# Patient Record
Sex: Male | Born: 1980
Health system: Southern US, Community
[De-identification: ages and names within clinical notes are randomized; demographics above are authoritative.]

## PROBLEM LIST (undated history)

## (undated) DIAGNOSIS — F32A Depression, unspecified: Secondary | ICD-10-CM

## (undated) DIAGNOSIS — F419 Anxiety disorder, unspecified: Secondary | ICD-10-CM

---

## 2013-12-23 ENCOUNTER — Encounter (HOSPITAL_COMMUNITY)
Admission: EM | Disposition: A | Discharge status: Home / Self Care | Payer: Self-pay | Source: Home / Self Care | Attending: Emergency Medicine

## 2013-12-23 ENCOUNTER — Encounter (HOSPITAL_COMMUNITY): Payer: Self-pay | Admitting: Emergency Medicine

## 2013-12-23 ENCOUNTER — Emergency Department (HOSPITAL_COMMUNITY): Payer: Worker's Compensation

## 2013-12-23 ENCOUNTER — Emergency Department (HOSPITAL_COMMUNITY): Payer: Worker's Compensation | Admitting: Anesthesiology

## 2013-12-23 ENCOUNTER — Ambulatory Visit (HOSPITAL_COMMUNITY)
Admission: EM | Admit: 2013-12-23 | Discharge: 2013-12-23 | Disposition: A | Discharge status: Home / Self Care | Payer: Worker's Compensation | Attending: Emergency Medicine | Admitting: Emergency Medicine

## 2013-12-23 ENCOUNTER — Encounter (HOSPITAL_COMMUNITY): Payer: Worker's Compensation | Admitting: Anesthesiology

## 2013-12-23 DIAGNOSIS — F17200 Nicotine dependence, unspecified, uncomplicated: Secondary | ICD-10-CM | POA: Insufficient documentation

## 2013-12-23 DIAGNOSIS — Y9269 Other specified industrial and construction area as the place of occurrence of the external cause: Secondary | ICD-10-CM | POA: Diagnosis not present

## 2013-12-23 DIAGNOSIS — W3189XA Contact with other specified machinery, initial encounter: Secondary | ICD-10-CM | POA: Insufficient documentation

## 2013-12-23 DIAGNOSIS — Y990 Civilian activity done for income or pay: Secondary | ICD-10-CM | POA: Insufficient documentation

## 2013-12-23 DIAGNOSIS — S6991XA Unspecified injury of right wrist, hand and finger(s), initial encounter: Secondary | ICD-10-CM

## 2013-12-23 DIAGNOSIS — S68019A Complete traumatic metacarpophalangeal amputation of unspecified thumb, initial encounter: Secondary | ICD-10-CM | POA: Insufficient documentation

## 2013-12-23 DIAGNOSIS — S61209A Unspecified open wound of unspecified finger without damage to nail, initial encounter: Secondary | ICD-10-CM | POA: Diagnosis not present

## 2013-12-23 HISTORY — PX: I & D EXTREMITY: SHX5045

## 2013-12-23 LAB — BASIC METABOLIC PANEL
Anion gap: 13 (ref 5–15)
BUN: 9 mg/dL (ref 6–23)
CO2: 24 mEq/L (ref 19–32)
Calcium: 8.9 mg/dL (ref 8.4–10.5)
Chloride: 102 mEq/L (ref 96–112)
Creatinine, Ser: 0.81 mg/dL (ref 0.50–1.35)
GFR calc Af Amer: >90 mL/min (ref >90)
GFR calc non Af Amer: >90 mL/min (ref >90)
Glucose, Bld: 101 mg/dL — ABNORMAL HIGH (ref 70–99)
Potassium: 4.2 mEq/L (ref 3.7–5.3)
Sodium: 139 mEq/L (ref 137–147)

## 2013-12-23 LAB — CBC WITH DIFFERENTIAL/PLATELET
Basophils Absolute: 0 10*3/uL (ref 0.0–0.1)
Basophils Relative: 0 % (ref 0–1)
Eosinophils Absolute: 0.1 10*3/uL (ref 0.0–0.7)
Eosinophils Relative: 0 % (ref 0–5)
HCT: 40.5 % (ref 39.0–52.0)
Hemoglobin: 14.1 g/dL (ref 13.0–17.0)
Lymphocytes Relative: 15 % (ref 12–46)
Lymphs Abs: 1.8 10*3/uL (ref 0.7–4.0)
MCH: 31.5 pg (ref 26.0–34.0)
MCHC: 34.8 g/dL (ref 30.0–36.0)
MCV: 90.4 fL (ref 78.0–100.0)
Monocytes Absolute: 0.8 10*3/uL (ref 0.1–1.0)
Monocytes Relative: 7 % (ref 3–12)
Neutro Abs: 9.4 10*3/uL — ABNORMAL HIGH (ref 1.7–7.7)
Neutrophils Relative %: 78 % — ABNORMAL HIGH (ref 43–77)
Platelets: 147 10*3/uL — ABNORMAL LOW (ref 150–400)
RBC: 4.48 MIL/uL (ref 4.22–5.81)
RDW: 13.2 % (ref 11.5–15.5)
WBC: 12 10*3/uL — ABNORMAL HIGH (ref 4.0–10.5)

## 2013-12-23 SURGERY — IRRIGATION AND DEBRIDEMENT EXTREMITY
Anesthesia: General | Laterality: Right

## 2013-12-23 MED ORDER — HYDROMORPHONE HCL PF 1 MG/ML IJ SOLN
0.5000 mg | Freq: Once | INTRAMUSCULAR | Status: AC
  Administered 2013-12-23: 0.5 mg via INTRAVENOUS
  Filled 2013-12-23: qty 1

## 2013-12-23 MED ORDER — PROMETHAZINE HCL 25 MG/ML IJ SOLN: 6.2500 mg | INTRAMUSCULAR | Status: DC | PRN

## 2013-12-23 MED ORDER — LIDOCAINE HCL (CARDIAC) 20 MG/ML IV SOLN
INTRAVENOUS | Status: AC
  Filled 2013-12-23: qty 5

## 2013-12-23 MED ORDER — CEFAZOLIN SODIUM 1-5 GM-% IV SOLN: 1.0000 g | Freq: Once | INTRAVENOUS | Status: DC

## 2013-12-23 MED ORDER — LACTATED RINGERS IV SOLN
INTRAVENOUS | Status: DC
  Administered 2013-12-23: 16:00:00 via INTRAVENOUS

## 2013-12-23 MED ORDER — CEFAZOLIN SODIUM-DEXTROSE 2-3 GM-% IV SOLR
2.0000 g | Freq: Once | INTRAVENOUS | Status: AC
  Administered 2013-12-23: 2 g via INTRAVENOUS
  Filled 2013-12-23: qty 50

## 2013-12-23 MED ORDER — ONDANSETRON HCL 4 MG/2ML IJ SOLN
INTRAMUSCULAR | Status: AC
  Filled 2013-12-23: qty 2

## 2013-12-23 MED ORDER — FENTANYL CITRATE 0.05 MG/ML IJ SOLN
INTRAMUSCULAR | Status: AC
  Filled 2013-12-23: qty 5

## 2013-12-23 MED ORDER — SODIUM CHLORIDE 0.9 % IR SOLN
Status: DC | PRN
  Administered 2013-12-23: 1000 mL
  Administered 2013-12-23: 6000 mL

## 2013-12-23 MED ORDER — LACTATED RINGERS IV SOLN
INTRAVENOUS | Status: DC | PRN
  Administered 2013-12-23: 16:00:00 via INTRAVENOUS

## 2013-12-23 MED ORDER — OXYCODONE HCL 5 MG/5ML PO SOLN: 5.0000 mg | Freq: Once | ORAL | Status: DC | PRN

## 2013-12-23 MED ORDER — MIDAZOLAM HCL 2 MG/2ML IJ SOLN
INTRAMUSCULAR | Status: AC
  Filled 2013-12-23: qty 2

## 2013-12-23 MED ORDER — OXYCODONE HCL 5 MG PO TABS: 5.0000 mg | ORAL_TABLET | Freq: Once | ORAL | Status: DC | PRN

## 2013-12-23 MED ORDER — MIDAZOLAM HCL 5 MG/5ML IJ SOLN
INTRAMUSCULAR | Status: DC | PRN
  Administered 2013-12-23: 2 mg via INTRAVENOUS

## 2013-12-23 MED ORDER — PROPOFOL 10 MG/ML IV BOLUS
INTRAVENOUS | Status: AC
  Filled 2013-12-23: qty 20

## 2013-12-23 MED ORDER — MINERAL OIL LIGHT 100 % EX OIL
TOPICAL_OIL | CUTANEOUS | Status: AC
  Filled 2013-12-23: qty 25

## 2013-12-23 MED ORDER — EPINEPHRINE HCL 1 MG/ML IJ SOLN
INTRAMUSCULAR | Status: AC
  Filled 2013-12-23: qty 1

## 2013-12-23 MED ORDER — SODIUM CHLORIDE 0.9 % IV SOLN
Freq: Once | INTRAVENOUS | Status: AC
  Administered 2013-12-23: 15:00:00 via INTRAVENOUS

## 2013-12-23 MED ORDER — ONDANSETRON HCL 4 MG/2ML IJ SOLN
INTRAMUSCULAR | Status: DC | PRN
  Administered 2013-12-23: 4 mg via INTRAVENOUS

## 2013-12-23 MED ORDER — MINERAL OIL LIGHT 100 % EX OIL
TOPICAL_OIL | CUTANEOUS | Status: DC | PRN
  Administered 2013-12-23: 1 via TOPICAL

## 2013-12-23 MED ORDER — FENTANYL CITRATE 0.05 MG/ML IJ SOLN
INTRAMUSCULAR | Status: DC | PRN
  Administered 2013-12-23 (×2): 100 ug via INTRAVENOUS
  Administered 2013-12-23: 50 ug via INTRAVENOUS

## 2013-12-23 MED ORDER — HYDROMORPHONE HCL PF 1 MG/ML IJ SOLN
0.2500 mg | INTRAMUSCULAR | Status: DC | PRN
  Administered 2013-12-23: 0.5 mg via INTRAVENOUS

## 2013-12-23 MED ORDER — LIDOCAINE HCL (CARDIAC) 20 MG/ML IV SOLN
INTRAVENOUS | Status: DC | PRN
  Administered 2013-12-23: 70 mg via INTRAVENOUS

## 2013-12-23 MED ORDER — PROPOFOL 10 MG/ML IV BOLUS
INTRAVENOUS | Status: DC | PRN
  Administered 2013-12-23: 160 mg via INTRAVENOUS

## 2013-12-23 MED ORDER — BUPIVACAINE HCL (PF) 0.25 % IJ SOLN
INTRAMUSCULAR | Status: AC
  Filled 2013-12-23: qty 30

## 2013-12-23 MED ORDER — OXYCODONE HCL 5 MG PO TABS: 5.0000 mg | ORAL_TABLET | ORAL | Status: DC | PRN

## 2013-12-23 MED ORDER — HYDROMORPHONE HCL PF 1 MG/ML IJ SOLN
INTRAMUSCULAR | Status: AC
  Filled 2013-12-23: qty 1

## 2013-12-23 MED ORDER — CEPHALEXIN 500 MG PO CAPS: 500.0000 mg | ORAL_CAPSULE | Freq: Four times a day (QID) | ORAL | Status: DC

## 2013-12-23 MED ORDER — SUCCINYLCHOLINE CHLORIDE 20 MG/ML IJ SOLN
INTRAMUSCULAR | Status: DC | PRN
  Administered 2013-12-23: 100 mg via INTRAVENOUS

## 2013-12-23 SURGICAL SUPPLY — 49 items
BANDAGE ELASTIC 3 VELCRO ST LF (GAUZE/BANDAGES/DRESSINGS) ×2 IMPLANT
BANDAGE ELASTIC 4 VELCRO ST LF (GAUZE/BANDAGES/DRESSINGS) ×2 IMPLANT
BANDAGE GAUZE ELAST BULKY 4 IN (GAUZE/BANDAGES/DRESSINGS) ×6 IMPLANT
BNDG COHESIVE 1X5 TAN STRL LF (GAUZE/BANDAGES/DRESSINGS) ×2 IMPLANT
BNDG CONFORM 2 STRL LF (GAUZE/BANDAGES/DRESSINGS) ×2 IMPLANT
BNDG GAUZE ELAST 4 BULKY (GAUZE/BANDAGES/DRESSINGS) ×2 IMPLANT
CORDS BIPOLAR (ELECTRODE) ×2 IMPLANT
COTTONBALL LRG STERILE PKG (GAUZE/BANDAGES/DRESSINGS) ×2 IMPLANT
CUFF TOURNIQUET SINGLE 18IN (TOURNIQUET CUFF) ×2 IMPLANT
CUFF TOURNIQUET SINGLE 24IN (TOURNIQUET CUFF) IMPLANT
DRSG ADAPTIC 3X8 NADH LF (GAUZE/BANDAGES/DRESSINGS) ×2 IMPLANT
DRSG MEPITEL 3X4 ME34 (GAUZE/BANDAGES/DRESSINGS) ×2 IMPLANT
ELECT REM PT RETURN 9FT ADLT (ELECTROSURGICAL)
ELECTRODE REM PT RTRN 9FT ADLT (ELECTROSURGICAL) IMPLANT
GAUZE XEROFORM 1X8 LF (GAUZE/BANDAGES/DRESSINGS) ×2 IMPLANT
GAUZE XEROFORM 5X9 LF (GAUZE/BANDAGES/DRESSINGS) ×2 IMPLANT
GLOVE BIOGEL M STRL SZ7.5 (GLOVE) ×2 IMPLANT
GLOVE SS BIOGEL STRL SZ 8 (GLOVE) ×1 IMPLANT
GLOVE SUPERSENSE BIOGEL SZ 8 (GLOVE) ×1
GOWN STRL REUS W/ TWL LRG LVL3 (GOWN DISPOSABLE) ×3 IMPLANT
GOWN STRL REUS W/ TWL XL LVL3 (GOWN DISPOSABLE) ×3 IMPLANT
GOWN STRL REUS W/TWL LRG LVL3 (GOWN DISPOSABLE) ×3
GOWN STRL REUS W/TWL XL LVL3 (GOWN DISPOSABLE) ×3
HANDPIECE INTERPULSE COAX TIP (DISPOSABLE)
KIT BASIN OR (CUSTOM PROCEDURE TRAY) ×2 IMPLANT
KIT ROOM TURNOVER OR (KITS) ×2 IMPLANT
MANIFOLD NEPTUNE II (INSTRUMENTS) ×2 IMPLANT
NEEDLE HYPO 25GX1X1/2 BEV (NEEDLE) IMPLANT
NS IRRIG 1000ML POUR BTL (IV SOLUTION) ×2 IMPLANT
PACK ORTHO EXTREMITY (CUSTOM PROCEDURE TRAY) ×2 IMPLANT
PAD ARMBOARD 7.5X6 YLW CONV (MISCELLANEOUS) ×4 IMPLANT
PAD CAST 4YDX4 CTTN HI CHSV (CAST SUPPLIES) ×1 IMPLANT
PADDING CAST ABS 4INX4YD NS (CAST SUPPLIES) ×1
PADDING CAST ABS COTTON 4X4 ST (CAST SUPPLIES) ×1 IMPLANT
PADDING CAST COTTON 4X4 STRL (CAST SUPPLIES) ×1
SET CYSTO W/LG BORE CLAMP LF (SET/KITS/TRAYS/PACK) ×2 IMPLANT
SET HNDPC FAN SPRY TIP SCT (DISPOSABLE) IMPLANT
SPLINT FIBERGLASS 3X12 (CAST SUPPLIES) ×2 IMPLANT
SPONGE GAUZE 4X4 12PLY (GAUZE/BANDAGES/DRESSINGS) ×2 IMPLANT
SPONGE LAP 18X18 X RAY DECT (DISPOSABLE) ×2 IMPLANT
SPONGE LAP 4X18 X RAY DECT (DISPOSABLE) ×2 IMPLANT
SUT PROLENE 4 0 PS 2 18 (SUTURE) ×8 IMPLANT
SYR CONTROL 10ML LL (SYRINGE) IMPLANT
TOWEL OR 17X24 6PK STRL BLUE (TOWEL DISPOSABLE) ×2 IMPLANT
TOWEL OR 17X26 10 PK STRL BLUE (TOWEL DISPOSABLE) ×2 IMPLANT
TUBE ANAEROBIC SPECIMEN COL (MISCELLANEOUS) IMPLANT
TUBE CONNECTING 12X1/4 (SUCTIONS) ×2 IMPLANT
WATER STERILE IRR 1000ML POUR (IV SOLUTION) ×2 IMPLANT
YANKAUER SUCT BULB TIP NO VENT (SUCTIONS) ×2 IMPLANT

## 2013-12-23 NOTE — Transfer of Care (Signed)
Immediate Anesthesia Transfer of Care Note  Patient: Philip OsgoodMathew Williamson  Procedure(s) Performed: Procedure(s): IRRIGATION AND DEBRIDEMENT Right Thumb with possible skin graft (Right)  Patient Location: PACU  Anesthesia Type:General  Level of Consciousness: awake, alert  and oriented  Airway & Oxygen Therapy: Patient Spontanous Breathing and Patient connected to nasal cannula oxygen  Post-op Assessment: Report given to PACU RN and Post -op Vital signs reviewed and stable  Post vital signs: Reviewed and stable  Complications: No apparent anesthesia complications

## 2013-12-23 NOTE — ED Provider Notes (Signed)
CSN: [id number]     Arrival date & time 12/23/13  1155 History   First MD Initiated Contact with Patient 12/23/13 1206     Chief Complaint  Patient presents with  . Hand Injury     (Consider location/radiation/quality/duration/timing/severity/associated sxs/prior Treatment) Patient is a 33 y.o. male presenting with hand injury. The history is provided by the patient. No language interpreter was used.  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 He pulled his thumb out of the machine causing distal thumb degloving injury with complete nail loss. No other injury.    History reviewed. No pertinent past medical history. History reviewed. No pertinent past surgical history. No family history on file. History  Substance Use Topics  . Smoking status: Current Every Day Smoker    Types: Cigarettes  . Smokeless tobacco: Not on file  . Alcohol Use: Yes    Review of Systems  Constitutional: Negative for fever.  Musculoskeletal:       See HPI.  Skin:       See HPI.  Neurological: Negative for numbness.      Allergies  Review of patient's allergies indicates no known allergies.  Home Medications   Prior to Admission medications   Medication Sig Start Date End Date Taking? Authorizing Provider  sertraline (ZOLOFT) 50 MG tablet Take 50 mg by mouth daily.   Yes Historical Provider, MD   BP 150/78  Pulse 99  Temp(Src) 97.9 F (36.6 C) (Oral)  Resp 18  SpO2 98% Physical Exam  Constitutional: He is oriented to person, place, and time. He appears well-developed and well-nourished. No distress.  Musculoskeletal:  Right thumb with degloving injury distal to PIP joint, including complete nail loss. Nail bed intact. No bony exposure.   Neurological: He is alert  and oriented to person, place, and time.  Skin: Skin is warm and dry.  Psychiatric: He has a normal mood and affect.    ED Course  Procedures (including critical care time) Labs Review Labs Reviewed - No data to display  Imaging Review Dg Finger Thumb Right  12/23/2013   CLINICAL DATA:  Laceration right thumb.  EXAM: RIGHT THUMB 2+V  COMPARISON:  None.  FINDINGS: A large skin defect is seen over the tip of the distal phalanx of the right thumb. No underlying fracture or foreign body is identified.  IMPRESSION: Laceration without underlying fracture or foreign body.   Electronically Signed   By: Drusilla Kannerhomas  Dalessio M.D.   On: 12/23/2013 13:44     EKG Interpretation None      MDM   Final diagnoses:  None    1. Degloving injury right thumb  Discussed with Dr. Amanda PeaGramig who will see patient in the ED to evaluate for surgery. Ancef given, pain controlled, tetanus is current.     Arnoldo HookerShari A Kwesi Sangha, PA-C 12/23/13 1513

## 2013-12-23 NOTE — ED Notes (Signed)
PA at bedside.

## 2013-12-23 NOTE — OR Nursing (Signed)
Discharge instructions reviewed with patient and his girlfriend, Autumn. Prescription given to Autumn for Keflex and Oxycodone. Instructed to make follow up appointment with Dr. Amanda PeaGramig in 10 days. Patient able to state symptoms to report to MD prior to office visit. Patient discharged from PACU via wheelchair accompanied by RN.

## 2013-12-23 NOTE — H&P (Signed)
Philip Williamson is an 33 y.o. male.   Chief Complaint: Amputation to the right thumb HPI: Patient presents with a industrial injury to the right thumb. He was at work and had a roller and pulley wrapped his thumb indicating the distal tip. At the IP joint he has significant loss of pulp.  I've reviewed his exam I would recommend skin grafting possible cross finger flap based on an operative conditions and he understands this.  He denies other injury.  He denies neck back chest or abdominal pain.  He is here and stable. He is pleasant. He notes no other injury as mentioned.  Ancef has been given and tetanus status is been addressed  History reviewed. No pertinent past medical history.  History reviewed. No pertinent past surgical history.  No family history on file. Social History:  reports that he has been smoking Cigarettes.  He has been smoking about 0.00 packs per day. He does not have any smokeless tobacco history on file. He reports that he drinks alcohol. He reports that he does not use illicit drugs.  Allergies: No Known Allergies   (Not in a hospital admission)  Results for orders placed during the hospital encounter of 12/23/13 (from the past 48 hour(s))  CBC WITH DIFFERENTIAL     Status: Abnormal   Collection Time    12/23/13  2:31 PM      Result Value Ref Range   WBC 12.0 (*) 4.0 - 10.5 K/uL   RBC 4.48  4.22 - 5.81 MIL/uL   Hemoglobin 14.1  13.0 - 17.0 g/dL   HCT 40.5  39.0 - 52.0 %   MCV 90.4  78.0 - 100.0 fL   MCH 31.5  26.0 - 34.0 pg   MCHC 34.8  30.0 - 36.0 g/dL   RDW 13.2  11.5 - 15.5 %   Platelets 147 (*) 150 - 400 K/uL   Neutrophils Relative % 78 (*) 43 - 77 %   Neutro Abs 9.4 (*) 1.7 - 7.7 K/uL   Lymphocytes Relative 15  12 - 46 %   Lymphs Abs 1.8  0.7 - 4.0 K/uL   Monocytes Relative 7  3 - 12 %   Monocytes Absolute 0.8  0.1 - 1.0 K/uL   Eosinophils Relative 0  0 - 5 %   Eosinophils Absolute 0.1  0.0 - 0.7 K/uL   Basophils Relative 0  0 - 1 %   Basophils Absolute 0.0  0.0 - 0.1 K/uL  BASIC METABOLIC PANEL     Status: Abnormal   Collection Time    12/23/13  2:31 PM      Result Value Ref Range   Sodium 139  137 - 147 mEq/L   Potassium 4.2  3.7 - 5.3 mEq/L   Chloride 102  96 - 112 mEq/L   CO2 24  19 - 32 mEq/L   Glucose, Bld 101 (*) 70 - 99 mg/dL   BUN 9  6 - 23 mg/dL   Creatinine, Ser 0.81  0.50 - 1.35 mg/dL   Calcium 8.9  8.4 - 10.5 mg/dL   GFR calc non Af Amer >90  >90 mL/min   GFR calc Af Amer >90  >90 mL/min   Comment: (NOTE)     The eGFR has been calculated using the CKD EPI equation.     This calculation has not been validated in all clinical situations.     eGFR's persistently <90 mL/min signify possible Chronic Kidney     Disease.  Anion gap 13  5 - 15   Dg Finger Thumb Right  12/23/2013   CLINICAL DATA:  Laceration right thumb.  EXAM: RIGHT THUMB 2+V  COMPARISON:  None.  FINDINGS: A large skin defect is seen over the tip of the distal phalanx of the right thumb. No underlying fracture or foreign body is identified.  IMPRESSION: Laceration without underlying fracture or foreign body.   Electronically Signed   By: Inge Rise M.D.   On: 12/23/2013 13:44    Review of Systems  Constitutional: Negative.   HENT: Negative.   Eyes: Negative.   Respiratory: Negative.   Cardiovascular: Negative.   Gastrointestinal: Negative.   Genitourinary: Negative.   Neurological: Negative.   Endo/Heme/Allergies: Negative.   Psychiatric/Behavioral: Negative.     Blood pressure 129/84, pulse 85, temperature 97.9 F (36.6 C), temperature source Oral, resp. rate 18, SpO2 98.00%. Physical Exam   Patient has a right thumb with amputation at the IP joint level distally. There is exposed nailbed with sterile matrix intact. There is exposed bone it appears FPL is intact EPL is intact there's no signs of infection no signs of compartment syndrome.  He has no prior injury to the thumb.  His nail plate is avulsed. He does have  indicated part. The skin in the thumb region and fingers he is generally dirty and greasy from his work.  The patient is alert and oriented in no acute distress the patient complains of pain in the affected upper extremity.  The patient is noted to have a normal HEENT exam.  Lung fields show equal chest expansion and no shortness of breath  abdomen exam is nontender without distention.  Lower extremity examination does not show any fracture dislocation or blood clot symptoms.  Pelvis is stable neck and back are stable and nontender  Assessment/Plan I would recommend immediate irrigation and debridement followed by full-thickness versus cross finger flap. Ice he showed him how cross finger flap works in terms of vascularity and timeframe duration for recovery. We discussed risk and benefits in all issues as they are germane to his upper semi-predicament. We'll move forward with attempted reconstruction to try and preserve length as much as possible.  Is a pleasure seeing today.   We are planning surgery for your upper extremity. The risk and benefits of surgery include risk of bleeding infection anesthesia damage to normal structures and failure of the surgery to accomplish its intended goals of relieving symptoms and restoring function with this in mind we'll going to proceed. I have specifically discussed with the patient the pre-and postoperative regime and the does and don'ts and risk and benefits in great detail. Risk and benefits of surgery also include risk of dystrophy chronic nerve pain failure of the healing process to go onto completion and other inherent risks of surgery The relavent the pathophysiology of the disease/injury process, as well as the alternatives for treatment and postoperative course of action has been discussed in great detail with the patient who desires to proceed.  We will do everything in our power to help you (the patient) restore function to the upper extremity. Is  a pleasure to see this patient today.    Jimma Ortman III,Adrie Picking M 12/23/2013, 3:30 PM

## 2013-12-23 NOTE — Anesthesia Preprocedure Evaluation (Signed)
Anesthesia Evaluation  Patient identified by MRN, date of birth, ID band Patient awake    Reviewed: Allergy & Precautions, H&P , NPO status , Patient's Chart, lab work & pertinent test results  History of Anesthesia Complications Negative for: history of anesthetic complications  Airway       Dental   Pulmonary Current Smoker,  breath sounds clear to auscultation        Cardiovascular negative cardio ROS  Rhythm:regular Rate:Normal     Neuro/Psych negative neurological ROS  negative psych ROS   GI/Hepatic negative GI ROS, Neg liver ROS,   Endo/Other  negative endocrine ROS  Renal/GU negative Renal ROS     Musculoskeletal   Abdominal   Peds  Hematology   Anesthesia Other Findings   Reproductive/Obstetrics negative OB ROS                           Anesthesia Physical Anesthesia Plan  ASA: II and emergent  Anesthesia Plan: General ETT, Rapid Sequence and Cricoid Pressure   Post-op Pain Management:    Induction:   Airway Management Planned:   Additional Equipment:   Intra-op Plan:   Post-operative Plan:   Informed Consent: I have reviewed the patients History and Physical, chart, labs and discussed the procedure including the risks, benefits and alternatives for the proposed anesthesia with the patient or authorized representative who has indicated his/her understanding and acceptance.   Dental Advisory Given  Plan Discussed with: Anesthesiologist, CRNA and Surgeon  Anesthesia Plan Comments:         Anesthesia Quick Evaluation

## 2013-12-23 NOTE — Anesthesia Postprocedure Evaluation (Signed)
  Anesthesia Post-op Note  Patient: Philip Williamson  Procedure(s) Performed: Procedure(s): IRRIGATION AND DEBRIDEMENT Right Thumb with possible skin graft (Right)  Patient Location: PACU  Anesthesia Type:General  Level of Consciousness: awake  Airway and Oxygen Therapy: Patient Spontanous Breathing  Post-op Pain: mild  Post-op Assessment: Post-op Vital signs reviewed  Post-op Vital Signs: Reviewed  Last Vitals:  Filed Vitals:   12/23/13 1755  BP: 125/76  Pulse: 88  Temp:   Resp: 22    Complications: No apparent anesthesia complications

## 2013-12-23 NOTE — Discharge Instructions (Signed)
Keep bandage clean and dry.  Call for any problems.  No smoking.  Criteria for driving a car: you should be off your pain medicine for 7-8 hours, able to drive one handed(confident), thinking clearly and feeling able in your judgement to drive. Continue elevation as it will decrease swelling.Keep your thumb still within the confines of the bandage/splint. Do not use ice. Call immediately for any sudden loss of feeling in your hand/arm or change in functional abilities of the extremity.  We recommend that you to take vitamin C 1000 mg a day to promote healing we also recommend that if you require her pain medicine that he take a stool softener to prevent constipation as most pain medicines will have constipation side effects. We recommend either Peri-Colace or Senokot and recommend that you also consider adding MiraLAX to prevent the constipation affects from pain medicine if you are required to use them. These medicines are over the counter and maybe purchased at a local pharmacy.  Please stop smoking

## 2013-12-23 NOTE — ED Notes (Signed)
Pt. Cut the tip of his rt. Thumb off in a bread bag machine.  [facility].  Tip of the thumb is in ice.  Mild bleeding noted.

## 2013-12-23 NOTE — ED Provider Notes (Signed)
Medical screening examination/treatment/procedure(s) were performed by non-physician practitioner and as supervising physician I was immediately available for consultation/collaboration.     Andreah Goheen, MD 12/23/13 1547 

## 2013-12-23 NOTE — Op Note (Signed)
See Dictation #[id number]  Amanda PeaGramig MD

## 2013-12-23 NOTE — ED Notes (Signed)
PA student at bedside.

## 2013-12-24 ENCOUNTER — Encounter (HOSPITAL_COMMUNITY): Payer: Self-pay | Admitting: Orthopedic Surgery

## 2013-12-24 NOTE — Op Note (Signed)
[facility]:  Williamson, Philip                ACCOUNT NO.:  [id number]  MEDICAL RECORD NO.:  [id number]  LOCATION:  [facility]                         FACILITY:  [facility]  PHYSICIAN:  Dionne AnoWilliam M. Tiegan Terpstra, M.D.DATE OF BIRTH:  1980-11-27  DATE OF PROCEDURE:  12/23/2013 DATE OF DISCHARGE:  12/23/2013                              OPERATIVE REPORT   PREOPERATIVE DIAGNOSIS:  Right thumb distal avulsion/amputation.  POSTOPERATIVE DIAGNOSIS:  Right thumb distal avulsion/amputation.  PROCEDURES: 1. Irrigation and debridement of skin, subcutaneous tissue, nail bed,     and associated soft tissues secondary to avulsion injury.  This was     an excisional debridement with scissor tip, knife, blade, and     curette. 2. Reimplantation of a full-thickness skin graft after defatting right     thumb.  This was a 2.5 x 2 cm graft utilizing his autologous part     which he brought in which was defatted. 3. Complex nail bed repair. 4. Right small finger, skin, subcutaneous tissue irrigation and     debridement.  SURGEON:  Dionne AnoWilliam M. Amanda PeaGramig, M.D.  ASSISTANT:  None.  COMPLICATION:  None.  ANESTHESIA:  General.  TOURNIQUET TIME:  0.  INDICATIONS FOR THE PROCEDURE:  This patient is a 33 year old male, status post on-the-job injury.  Today, he had an avulsive injury to his thumb fairly complex.  I counseled him in regards to risks and benefits of surgery, time frame, duration of recovery, __________, etc.  OPERATIVE PROCEDURE:  The patient was taken to the operative arena, underwent a smooth induction of general anesthesia, prepped and draped in usual sterile fashion with Betadine scrub and paint.  At this time, I did perform a Hibiclens scrub of the amputated body part __________ good repair.  I discussed with the patient preoperatively possible need for cross-finger flap or island-type flap.  However, the tissue was reasonable enough.  I felt that reimplantation of the avulsed part would be certainly in his  benefit.  At this time, I debrided the skin, subcutaneous tissue, and associated structures including nail bed tissue with 3-4 L of saline.  Following this, the amputated part was defatted, pie crusted to allow for the egression of hematoma, fluid, and then replaced in its native state.  It actually fit and contoured beautifully.  There was good refill.  There was no exposed distal phalanx bone and thus, I felt reasonably optimistic about success.  I performed a complex nail bed repair with a fine chromic suture under 4.0 loupe magnification and following placement of the 2 x 2.5 part/distal portion of the thumb, I then performed placement of Mepitel, Xeroform, glycerin sterile cotton balls with a saline mixed in the mixture and then made a good cotton bolster dressing of the tip of the thumb.  He had excellent refill.  Approximately 10 mL of Sensorcaine without epinephrine was placed in a postoperative analgesia.  The patient also underwent I and D of a skin and subcutaneous tissue region about the small finger.  This did not require any meaningful grafting and should heal by secondary intention.  The patient tolerated this well.  He will be discharged home on Keflex, OxyIR 5 mg, vitamin C, Peri-Colace,  or standard postop algorithm.  I will see him back in the office in 10 days and check his skin graft. These notes have been discussed and all was questions encouraged and answered.  __________ postoperative management.     Dionne AnoWilliam M. Amanda PeaGramig, M.D.     [facility]  D:  12/23/2013  T:  12/24/2013  Job:  [id number]

## 2014-01-05 NOTE — Addendum Note (Signed)
Addendum created 01/05/14 2037 by Judie Petitharlene Edwards, MD   Modules edited: Anesthesia Responsible Staff

## 2015-12-16 DIAGNOSIS — L089 Local infection of the skin and subcutaneous tissue, unspecified: Secondary | ICD-10-CM | POA: Diagnosis not present

## 2015-12-16 DIAGNOSIS — B353 Tinea pedis: Secondary | ICD-10-CM | POA: Diagnosis not present

## 2015-12-16 DIAGNOSIS — B9689 Other specified bacterial agents as the cause of diseases classified elsewhere: Secondary | ICD-10-CM | POA: Diagnosis not present

## 2015-12-29 DIAGNOSIS — R21 Rash and other nonspecific skin eruption: Secondary | ICD-10-CM | POA: Diagnosis not present

## 2016-01-13 DIAGNOSIS — L309 Dermatitis, unspecified: Secondary | ICD-10-CM | POA: Diagnosis not present

## 2016-01-26 DIAGNOSIS — L258 Unspecified contact dermatitis due to other agents: Secondary | ICD-10-CM | POA: Diagnosis not present

## 2016-03-23 DIAGNOSIS — Z23 Encounter for immunization: Secondary | ICD-10-CM | POA: Diagnosis not present

## 2016-04-06 ENCOUNTER — Encounter (HOSPITAL_COMMUNITY): Payer: Self-pay | Admitting: Emergency Medicine

## 2016-04-06 ENCOUNTER — Emergency Department (HOSPITAL_COMMUNITY): Payer: Worker's Compensation

## 2016-04-06 ENCOUNTER — Emergency Department (HOSPITAL_COMMUNITY)
Admission: EM | Admit: 2016-04-06 | Discharge: 2016-04-06 | Disposition: A | Discharge status: Home / Self Care | Payer: Worker's Compensation | Attending: Emergency Medicine | Admitting: Emergency Medicine

## 2016-04-06 DIAGNOSIS — Y999 Unspecified external cause status: Secondary | ICD-10-CM | POA: Insufficient documentation

## 2016-04-06 DIAGNOSIS — W228XXA Striking against or struck by other objects, initial encounter: Secondary | ICD-10-CM | POA: Diagnosis not present

## 2016-04-06 DIAGNOSIS — Y929 Unspecified place or not applicable: Secondary | ICD-10-CM | POA: Insufficient documentation

## 2016-04-06 DIAGNOSIS — F17210 Nicotine dependence, cigarettes, uncomplicated: Secondary | ICD-10-CM | POA: Diagnosis not present

## 2016-04-06 DIAGNOSIS — S6992XA Unspecified injury of left wrist, hand and finger(s), initial encounter: Secondary | ICD-10-CM | POA: Diagnosis present

## 2016-04-06 DIAGNOSIS — S60222A Contusion of left hand, initial encounter: Secondary | ICD-10-CM

## 2016-04-06 DIAGNOSIS — Y939 Activity, unspecified: Secondary | ICD-10-CM | POA: Diagnosis not present

## 2016-04-06 MED ORDER — AMOXICILLIN-POT CLAVULANATE 875-125 MG PO TABS
1.0000 | ORAL_TABLET | Freq: Once | ORAL | Status: AC
  Administered 2016-04-06: 1 via ORAL
  Filled 2016-04-06: qty 1

## 2016-04-06 MED ORDER — AMOXICILLIN-POT CLAVULANATE 875-125 MG PO TABS: 1.0000 | ORAL_TABLET | Freq: Two times a day (BID) | ORAL | 0 refills | Status: DC

## 2016-04-06 NOTE — ED Triage Notes (Signed)
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

## 2016-04-06 NOTE — ED Provider Notes (Signed)
[facility]-EMERGENCY DEPT Provider Note   CSN: [id number] Arrival date & time: 04/06/16  0014   By signing my name below, I, Freida Busman, attest that this documentation has been prepared under the direction and in the presence of Tomasita Crumble, MD . Electronically Signed: Freida Busman, Scribe. 04/06/2016. 3:58 AM.   History   Chief Complaint Chief Complaint  Patient presents with  . Hand Injury    accidentally stabbed self with screwdriver    The history is provided by the patient. No language interpreter was used.     HPI Comments:  Philip Williamson is a 35 y.o. male who presents to the Emergency Department complaining of a wound to the left hand, sustained this AM. Pt states he accidentally stabbed himself in the palm of his hand. He reports sharp pain with squeezing motions. He states he applied neosporin. Tetanus status is UTD.  History reviewed. No pertinent past medical history.  There are no active problems to display for this patient.   Past Surgical History:  Procedure Laterality Date  . I&D EXTREMITY Right 12/23/2013   Procedure: IRRIGATION AND DEBRIDEMENT Right Thumb with possible skin graft;  Surgeon: Dominica Severin, MD;  Location: [facility] OR;  Service: Orthopedics;  Laterality: Right;       Home Medications    Prior to Admission medications   Medication Sig Start Date End Date Taking? Authorizing Provider  amoxicillin-clavulanate (AUGMENTIN) 875-125 MG tablet Take 1 tablet by mouth 2 (two) times daily. 04/06/16   Tomasita Crumble, MD  cephALEXin (KEFLEX) 500 MG capsule Take 1 capsule (500 mg total) by mouth 4 (four) times daily. 12/23/13   Dominica Severin, MD  oxyCODONE (OXY IR/ROXICODONE) 5 MG immediate release tablet Take 1 tablet (5 mg total) by mouth every 4 (four) hours as needed for severe pain. 12/23/13   Dominica Severin, MD  sertraline (ZOLOFT) 50 MG tablet Take 50 mg by mouth daily.    Historical Provider, MD    Family History No family history on file.  Social  History Social History  Substance Use Topics  . Smoking status: Current Every Day Smoker    Packs/day: 1.00    Types: Cigarettes  . Smokeless tobacco: Never Used  . Alcohol use Yes     Comment: socially     Allergies   Review of patient's allergies indicates no known allergies.   Review of Systems Review of Systems 10 systems reviewed and all are negative for acute change except as noted in the HPI.  Physical Exam Updated Vital Signs BP 117/78 (BP Location: Right Arm)   Pulse 82   Temp 98 F (36.7 C) (Oral)   Resp 19   Ht 5\' 4"  (1.626 m)   Wt 170 lb (77.1 kg)   SpO2 96%   BMI 29.18 kg/m   Physical Exam  Constitutional: He is oriented to person, place, and time. Vital signs are normal. He appears well-developed and well-nourished.  Non-toxic appearance. He does not appear ill. No distress.  HENT:  Head: Normocephalic and atraumatic.  Nose: Nose normal.  Mouth/Throat: Oropharynx is clear and moist. No oropharyngeal exudate.  Eyes: Conjunctivae and EOM are normal. Pupils are equal, round, and reactive to light. No scleral icterus.  Neck: Normal range of motion. Neck supple. No tracheal deviation, no edema, no erythema and normal range of motion present. No thyroid mass and no thyromegaly present.  Cardiovascular: Normal rate, regular rhythm, S1 normal, S2 normal, normal heart sounds, intact distal pulses and normal pulses.  Exam reveals  no gallop and no friction rub.   No murmur heard. Pulmonary/Chest: Effort normal and breath sounds normal. No respiratory distress. He has no wheezes. He has no rhonchi. He has no rales.  Abdominal: Soft. Normal appearance and bowel sounds are normal. He exhibits no distension, no ascites and no mass. There is no hepatosplenomegaly. There is no tenderness. There is no rebound, no guarding and no CVA tenderness.  Musculoskeletal: Normal range of motion.  Lymphadenopathy:    He has no cervical adenopathy.  Neurological: He is alert and  oriented to person, place, and time. He has normal strength. No cranial nerve deficit or sensory deficit.  Skin: Skin is warm, dry and intact. No petechiae and no rash noted. He is not diaphoretic. No erythema. No pallor.  0.5cm puncture to the palmar surface of the left hand  Area is swollen and TTP; no active bleeding   Nursing note and vitals reviewed.    ED Treatments / Results  DIAGNOSTIC STUDIES:  Oxygen Saturation is 98% on RA, normal by my interpretation.    COORDINATION OF CARE:  3:49 AM Discussed treatment plan with pt at bedside and pt agreed to plan.  Labs (all labs ordered are listed, but only abnormal results are displayed) Labs Reviewed - No data to display  EKG  EKG Interpretation None       Radiology Dg Hand Complete Left  Result Date: 04/06/2016 CLINICAL DATA:  35 year old male with trauma to the left hand with screwdriver. EXAM: LEFT HAND - COMPLETE 3+ VIEW COMPARISON:  None. FINDINGS: There is no acute fracture or dislocation. The bones are well mineralized. No arthritic changes. The soft tissues appear unremarkable. A punctate radiopaque focus noted over the skin at the first MCP joint. Clinical correlation is recommended. IMPRESSION: Negative. Electronically Signed   By: Elgie CollardArash  Radparvar M.D.   On: 04/06/2016 04:27    Procedures Procedures (including critical care time)  Medications Ordered in ED Medications  amoxicillin-clavulanate (AUGMENTIN) 875-125 MG per tablet 1 tablet (1 tablet Oral Given 04/06/16 0417)     Initial Impression / Assessment and Plan / ED Course  I have reviewed the triage vital signs and the nursing notes.  Pertinent labs & imaging results that were available during my care of the patient were reviewed by me and considered in my medical decision making (see chart for details).  Clinical Course    Patient presents to the ED for hand pain after trauma with a screwdriver.  There is a area of swelling.  No redness but there  is a hard area.  Hard to tell if this is indurated from early infection vs. Normal swelling in trauma.  Will treat with 3 days of abx and PCP fu for wound check.  Tetanus is already UTD.  Xray neg for trauma.  I evaluated area of foreign body and there is no acute injury in that area.  He appears well and in NAD. Vs remain within his normal limits and he is safe for DC.  Final Clinical Impressions(s) / ED Diagnoses   Final diagnoses:  Contusion of left hand, initial encounter    New Prescriptions New Prescriptions   AMOXICILLIN-CLAVULANATE (AUGMENTIN) 875-125 MG TABLET    Take 1 tablet by mouth 2 (two) times daily.   I personally performed the services described in this documentation, which was scribed in my presence. The recorded information has been reviewed and is accurate.       Tomasita CrumbleAdeleke Othell Jaime, MD 04/06/16 [phone]

## 2016-04-06 NOTE — ED Notes (Signed)
Pt provided with d/c instructions at this time. Pt verbalizes understanding of d/c instructions at this time.  Pt provided with RX for amoxicillin.  Pt verbalizes understanding of RX directions. Pt in no apparent distress at this time.  Pt ambulatory at time of d/c.

## 2016-04-06 NOTE — ED Notes (Signed)
Patient transported to X-ray at this time via ED stretcher.  Pt in no apparent distress at this time.   

## 2016-07-09 DIAGNOSIS — L309 Dermatitis, unspecified: Secondary | ICD-10-CM | POA: Diagnosis not present

## 2016-12-08 DIAGNOSIS — Z720 Tobacco use: Secondary | ICD-10-CM | POA: Diagnosis not present

## 2016-12-08 DIAGNOSIS — K047 Periapical abscess without sinus: Secondary | ICD-10-CM | POA: Diagnosis not present

## 2016-12-08 DIAGNOSIS — F329 Major depressive disorder, single episode, unspecified: Secondary | ICD-10-CM | POA: Diagnosis not present

## 2017-03-21 DIAGNOSIS — L089 Local infection of the skin and subcutaneous tissue, unspecified: Secondary | ICD-10-CM | POA: Diagnosis not present

## 2017-03-21 DIAGNOSIS — L309 Dermatitis, unspecified: Secondary | ICD-10-CM | POA: Diagnosis not present

## 2017-03-29 DIAGNOSIS — Z23 Encounter for immunization: Secondary | ICD-10-CM | POA: Diagnosis not present

## 2017-04-04 DIAGNOSIS — L302 Cutaneous autosensitization: Secondary | ICD-10-CM | POA: Diagnosis not present

## 2017-04-04 DIAGNOSIS — L258 Unspecified contact dermatitis due to other agents: Secondary | ICD-10-CM | POA: Diagnosis not present

## 2017-06-28 DIAGNOSIS — L309 Dermatitis, unspecified: Secondary | ICD-10-CM | POA: Diagnosis not present

## 2017-06-28 DIAGNOSIS — F329 Major depressive disorder, single episode, unspecified: Secondary | ICD-10-CM | POA: Diagnosis not present

## 2017-06-28 DIAGNOSIS — Z9852 Vasectomy status: Secondary | ICD-10-CM | POA: Diagnosis not present

## 2017-06-28 DIAGNOSIS — Z720 Tobacco use: Secondary | ICD-10-CM | POA: Diagnosis not present

## 2017-07-23 DIAGNOSIS — Z3009 Encounter for other general counseling and advice on contraception: Secondary | ICD-10-CM | POA: Diagnosis not present

## 2017-08-08 DIAGNOSIS — Z302 Encounter for sterilization: Secondary | ICD-10-CM | POA: Diagnosis not present

## 2017-08-29 DIAGNOSIS — Z0271 Encounter for disability determination: Secondary | ICD-10-CM | POA: Diagnosis not present

## 2018-01-01 DIAGNOSIS — Z0000 Encounter for general adult medical examination without abnormal findings: Secondary | ICD-10-CM | POA: Diagnosis not present

## 2018-01-01 DIAGNOSIS — Z131 Encounter for screening for diabetes mellitus: Secondary | ICD-10-CM | POA: Diagnosis not present

## 2018-01-01 DIAGNOSIS — Z136 Encounter for screening for cardiovascular disorders: Secondary | ICD-10-CM | POA: Diagnosis not present

## 2018-03-25 DIAGNOSIS — Z23 Encounter for immunization: Secondary | ICD-10-CM | POA: Diagnosis not present

## 2018-03-25 DIAGNOSIS — J988 Other specified respiratory disorders: Secondary | ICD-10-CM | POA: Diagnosis not present

## 2018-04-10 DIAGNOSIS — E785 Hyperlipidemia, unspecified: Secondary | ICD-10-CM | POA: Diagnosis not present

## 2019-02-05 DIAGNOSIS — E785 Hyperlipidemia, unspecified: Secondary | ICD-10-CM | POA: Diagnosis not present

## 2019-02-05 DIAGNOSIS — Z0000 Encounter for general adult medical examination without abnormal findings: Secondary | ICD-10-CM | POA: Diagnosis not present

## 2020-04-27 DIAGNOSIS — Z23 Encounter for immunization: Secondary | ICD-10-CM | POA: Diagnosis not present

## 2020-04-28 DIAGNOSIS — M79673 Pain in unspecified foot: Secondary | ICD-10-CM | POA: Diagnosis not present

## 2020-06-16 DIAGNOSIS — J069 Acute upper respiratory infection, unspecified: Secondary | ICD-10-CM | POA: Diagnosis not present

## 2020-06-16 DIAGNOSIS — R509 Fever, unspecified: Secondary | ICD-10-CM | POA: Diagnosis not present

## 2020-06-18 DIAGNOSIS — J069 Acute upper respiratory infection, unspecified: Secondary | ICD-10-CM | POA: Diagnosis not present

## 2020-06-18 DIAGNOSIS — Z6829 Body mass index (BMI) 29.0-29.9, adult: Secondary | ICD-10-CM | POA: Diagnosis not present

## 2020-12-15 DIAGNOSIS — M545 Low back pain, unspecified: Secondary | ICD-10-CM | POA: Diagnosis not present

## 2020-12-23 ENCOUNTER — Encounter: Payer: Self-pay | Admitting: Physical Therapy

## 2020-12-23 ENCOUNTER — Ambulatory Visit: Payer: BC Managed Care – PPO | Admitting: Physical Therapy

## 2020-12-23 ENCOUNTER — Other Ambulatory Visit: Payer: Self-pay

## 2020-12-23 ENCOUNTER — Ambulatory Visit: Payer: BC Managed Care – PPO | Attending: Family Medicine | Admitting: Physical Therapy

## 2020-12-23 DIAGNOSIS — R252 Cramp and spasm: Secondary | ICD-10-CM | POA: Diagnosis not present

## 2020-12-23 DIAGNOSIS — M545 Low back pain, unspecified: Secondary | ICD-10-CM | POA: Diagnosis not present

## 2020-12-23 DIAGNOSIS — G8929 Other chronic pain: Secondary | ICD-10-CM

## 2020-12-23 DIAGNOSIS — R293 Abnormal posture: Secondary | ICD-10-CM

## 2020-12-23 NOTE — Patient Instructions (Addendum)
Trigger Point Dry Needling  What is Trigger Point Dry Needling (DN)? DN is a physical therapy technique used to treat muscle pain and dysfunction. Specifically, DN helps deactivate muscle trigger points (muscle knots).  A thin filiform needle is used to penetrate the skin and stimulate the underlying trigger point. The goal is for a local twitch response (LTR) to occur and for the trigger point to relax. No medication of any kind is injected during the procedure.   What Does Trigger Point Dry Needling Feel Like?  The procedure feels different for each individual patient. Some patients report that they do not actually feel the needle enter the skin and overall the process is not painful. Very mild bleeding may occur. However, many patients feel a deep cramping in the muscle in which the needle was inserted. This is the local twitch response.   How Will I feel after the treatment? Soreness is normal, and the onset of soreness may not occur for a few hours. Typically this soreness does not last longer than two days.  Bruising is uncommon, however; ice can be used to decrease any possible bruising.  In rare cases feeling tired or nauseous after the treatment is normal. In addition, your symptoms may get worse before they get better, this period will typically not last longer than 24 hours.   What Can I do After My Treatment? Increase your hydration by drinking more water for the next 24 hours. You may place ice or heat on the areas treated that have become sore, however, do not use heat on inflamed or bruised areas. Heat often brings more relief post needling. You can continue your regular activities, but vigorous activity is not recommended initially after the treatment for 24 hours. DN is best combined with other physical therapy such as strengthening, stretching, and other therapies.    Access Code: [email or web address] URL: [email or web address] Date: 12/23/2020 Prepared by: Anabel Halon  Exercises Cat Cow - 2 x daily - 7 x weekly - 1 sets - 10 reps - 5s hold Child's Pose Stretch - 2 x daily - 7 x weekly - 1 sets - 5 reps - 10s hold Child's Pose with Sidebending - 2 x daily - 7 x weekly - 1 sets - 3 reps - 10s hold Half Kneeling Hip Flexor Stretch - 2 x daily - 7 x weekly - 1 sets - 2 reps - 20s hold Supine Piriformis Stretch with Leg Straight - 2 x daily - 7 x weekly - 1 sets - 2 reps - 20s hold

## 2020-12-23 NOTE — Therapy (Signed)
[facility] [address] [place], [place], [address] Phone: [phone]   Fax:  [phone]  Physical Therapy Evaluation  Patient Details  Name: Philip Williamson MRN: [id number] Date of Birth: 09-Jan-1981 Referring Provider (PT): Naomie Dean, MD  Encounter Date: 12/23/2020   PT End of Session - 12/23/20 0947     Visit Number 1    Date for PT Re-Evaluation 02/17/21    Authorization Type BCBS    PT Start Time 0848    PT Stop Time 0930    PT Time Calculation (min) 42 min    Activity Tolerance Patient tolerated treatment well    Behavior During Therapy [facility] for tasks assessed/performed             History reviewed. No pertinent past medical history.  Past Surgical History:  Procedure Laterality Date   I & D EXTREMITY Right 12/23/2013   Procedure: IRRIGATION AND DEBRIDEMENT Right Thumb with possible skin graft;  Surgeon: Dominica Severin, MD;  Location: [facility] OR;  Service: Orthopedics;  Laterality: Right;    There were no vitals filed for this visit.    Subjective Assessment - 12/23/20 0805     Subjective Patient presenting due to low back pain. States that pain began around the time of the end of his [employer] career (2012). He has good days and bad days but bad days are becoming more frequent. He states that back pain is midline.    Limitations Walking    Currently in Pain? Yes    Pain Score 7     Pain Location Back    Pain Orientation Lower    Pain Descriptors / Indicators Tightness;Dull    Pain Type Chronic pain    Pain Onset More than a month ago    Pain Frequency Constant   varying degrees   Aggravating Factors  walking on pavement, swimming    Pain Relieving Factors ibuprofen                [facility] PT Assessment - 12/23/20 0001       Assessment   Medical Diagnosis M54.50 (ICD-10-CM) - Low back pain, unspecified    Referring Provider (PT) Naomie Dean, MD    Onset Date/Surgical Date --   Chronic    Hand Dominance Right    Next MD Visit None    Prior Therapy Yes      Precautions   Precautions None      Restrictions   Weight Bearing Restrictions No      Balance Screen   Has the patient fallen in the past 6 months No      Home Environment   Living Environment Private residence    Living Arrangements Spouse/significant other    Home Layout Two level      Prior Function   Level of Independence Independent    Vocation Full time employment    Media planner      Cognition   Overall Cognitive Status Within Functional Limits for tasks assessed      Observation/Other Assessments   Focus on Therapeutic Outcomes (FOTO)  40% (goal 57)      Posture/Postural Control   Posture/Postural Control Postural limitations    Postural Limitations Decreased lumbar lordosis;Anterior pelvic tilt      ROM / Strength   AROM / PROM / Strength AROM;Strength      AROM   AROM Assessment Site Lumbar;Hip    Right Hip External Rotation  26  Right Hip Internal Rotation  25    Left Hip External Rotation  27    Left Hip Internal Rotation  32    Lumbar Flexion 63    Lumbar Extension 16    Lumbar - Right Side Bend WNL    Lumbar - Left Side Bend 75%    Lumbar - Right Rotation 50%    Lumbar - Left Rotation 75%      Strength   Strength Assessment Site Hip;Knee;Ankle    Right/Left Hip Right;Left    Right Hip Flexion 4/5   pain   Right Hip Extension 4/5    Right Hip External Rotation  5/5   pain   Right Hip Internal Rotation 5/5    Right Hip ABduction 4+/5    Right Hip ADduction 4+/5    Left Hip Flexion 4+/5    Left Hip Extension 5/5    Left Hip External Rotation 5/5    Left Hip Internal Rotation 5/5    Left Hip ABduction 4+/5    Left Hip ADduction 4/5    Right/Left Knee Right;Left    Right Knee Flexion 4+/5    Right Knee Extension 5/5    Left Knee Flexion 5/5    Left Knee Extension 5/5    Right Ankle Dorsiflexion 5/5    Right Ankle Plantar Flexion 4+/5     Left Ankle Dorsiflexion 5/5    Left Ankle Plantar Flexion 4+/5      Flexibility   Soft Tissue Assessment /Muscle Length yes    Hamstrings WNL      Special Tests    Special Tests Lumbar;Sacrolliac Tests;Hip Special Tests    Lumbar Tests FABER test    Sacroiliac Tests  Gaenslen's Test    Hip Special Tests  Luisa Hart [facility]) Test;Hip Scouring      FABER test   findings Positive    Side LEft      Sacral thrust    Findings Positive      Gaenslen's test   Findings Positive    Side  Right      Luisa Hart (FABER) Test   Findings Positive    Side Right      Hip Scouring   Findings Positive    Side Right      Transfers   Transfers Sit to Stand;Stand to Sit    Sit to Stand 7: Independent    Stand to Sit 7: Independent    Comments Use of valsalva manuever with sit <>stand                        Objective measurements completed on examination: See above findings.               PT Education - 12/23/20 0850     Education Details Access Code: [email or web address]    Person(s) Educated Patient    Methods Explanation;Demonstration;Tactile cues;Verbal cues;Handout    Comprehension Verbalized understanding;Returned demonstration;Verbal cues required;Tactile cues required              PT Short Term Goals - 12/23/20 6063       PT SHORT TERM GOAL #1   Title Patient will be independent with HEP for continued progression at home.    Time 4    Period Weeks    Status New    Target Date 01/20/21      PT SHORT TERM GOAL #2   Title Patient will demonstrate 85 degrees lumbar flexion and 22 degrees lumbar extension  to indicate improved mobility for work duties.    Baseline flexion 63, extension 16    Time 4    Period Weeks    Status New    Target Date 01/20/21      PT SHORT TERM GOAL #3   Title Patient will appropriately demonstrate core activation by completing x5 sit to stands without use of valsalva manuever.    Time 4    Period Weeks    Status New     Target Date 01/20/21               PT Long Term Goals - 12/23/20 0943       PT LONG TERM GOAL #1   Title Patient will be independent with advanced HEP for long term management of symptoms post D/C.    Time 8    Period Weeks    Status New    Target Date 02/17/21      PT LONG TERM GOAL #2   Title Patient will score >/= 57 on FOTO to indicate improved overall function.    Baseline 40    Time 8    Period Weeks    Status New    Target Date 02/17/21      PT LONG TERM GOAL #3   Title Patient will report no more than 3/10 pain for two consecutive weeks indicating progression towards resolution of symptoms.    Baseline 7/10    Time 8    Period Weeks    Status New    Target Date 02/17/21                    Plan - 12/23/20 0850     Clinical Impression Statement Patient is a 40 y/o male referred due to low back pain. PMH includes sleep apnea. Patient reported activity limitations include prolonged standing and ambulation over firm surfaces. He demonstrated no gross LE strength abnormalities however, he reports pain hip pain with internal and external rotation testing. He exhibits lumbar AROM impairments in all directions except Rt lateral flexion. Patient reports lumbar pain with movement into Rt and Lt posterior quadrant indicating possible facet joint arthropathy. Patient reports pain with FABER, hip scour, and Gaenslen's on Rt side indicating possible hip pathology as well. Core strength impairments apparent as well as patient resorts to use of valsalva manuever with all functional transfers and bed mobility. He would benefit from skilled therapeutic intervention to address impairments for decreased pain and improved functional activity tolerance.    Personal Factors and Comorbidities Time since onset of injury/illness/exacerbation;Comorbidity 1    Comorbidities sleep apnea    Examination-Activity Limitations Locomotion Level    Examination-Participation Restrictions  Occupation    Stability/Clinical Decision Making Stable/Uncomplicated    Clinical Decision Making Low    Rehab Potential Excellent    PT Frequency 1x / week    PT Duration 8 weeks    PT Treatment/Interventions ADLs/Self Care Home Management;Aquatic Therapy;Cryotherapy;Electrical Stimulation;Iontophoresis 4mg /ml Dexamethasone;Moist Heat;Gait training;Functional mobility training;Stair training;Therapeutic activities;Therapeutic exercise;Balance training;Patient/family education;Manual techniques;Dry needling;Passive range of motion;Taping;Spinal Manipulations;Joint Manipulations    PT Next Visit Plan review HEP; begin gentle core strengthening; focus on breathing with exertion; global hip/lumbar mobility    PT Home Exercise Plan Access Code: [email or web address]    Consulted and Agree with Plan of Care Patient             Patient will benefit from skilled therapeutic intervention in order to improve the following deficits and impairments:  Abnormal gait,  Decreased activity tolerance, Decreased range of motion, Difficulty walking, Hypomobility, Impaired flexibility, Increased muscle spasms, Increased fascial restricitons, Improper body mechanics, Postural dysfunction, Pain  Visit Diagnosis: Chronic midline low back pain without sciatica - Plan: PT plan of care cert/re-cert  Cramp and spasm - Plan: PT plan of care cert/re-cert  Abnormal posture - Plan: PT plan of care cert/re-cert     Problem List There are no problems to display for this patient.   Anabel HalonKayla Kensli Bowley PT, DPT  12/23/20 9:52 AM   [facility] [address] [place], [place], [address] Phone: [phone]   Fax:  [phone]  Name: Philip Williamson MRN: [id number] Date of Birth: 03/25/1981

## 2020-12-29 ENCOUNTER — Ambulatory Visit: Payer: BC Managed Care – PPO

## 2020-12-29 ENCOUNTER — Other Ambulatory Visit: Payer: Self-pay

## 2020-12-29 DIAGNOSIS — M545 Low back pain, unspecified: Secondary | ICD-10-CM | POA: Diagnosis not present

## 2020-12-29 DIAGNOSIS — R252 Cramp and spasm: Secondary | ICD-10-CM

## 2020-12-29 DIAGNOSIS — R293 Abnormal posture: Secondary | ICD-10-CM

## 2020-12-29 DIAGNOSIS — G8929 Other chronic pain: Secondary | ICD-10-CM | POA: Diagnosis not present

## 2020-12-29 NOTE — Therapy (Signed)
[facility] [address], [address] [place], [place], [address] Phone: [phone]   Fax:  [phone]  Physical Therapy Treatment  Patient Details  Name: Philip Williamson MRN: [id number] Date of Birth: [birth year] Referring Provider (PT): Naomie Dean, MD   Encounter Date: 12/29/2020   PT End of Session - 12/29/20 0933     Visit Number 2    Date for PT Re-Evaluation 02/17/21    Authorization Type BCBS    PT Start Time [phone]    PT Stop Time 0928    PT Time Calculation (min) 36 min    Activity Tolerance Patient tolerated treatment well    Behavior During Therapy [facility] for tasks assessed/performed             History reviewed. No pertinent past medical history.  Past Surgical History:  Procedure Laterality Date   I & D EXTREMITY Right 12/23/2013   Procedure: IRRIGATION AND DEBRIDEMENT Right Thumb with possible skin graft;  Surgeon: Dominica Severin, MD;  Location: [facility] OR;  Service: Orthopedics;  Laterality: Right;    There were no vitals filed for this visit.   Subjective Assessment - 12/29/20 0853     Subjective I'm doing the exercises.    Currently in Pain? Yes    Pain Score 4     Pain Location Back    Pain Orientation Lower;Left;Right    Pain Descriptors / Indicators Tightness;Dull    Pain Onset More than a month ago    Pain Frequency Constant    Aggravating Factors  standing on concrete at work    Pain Relieving Factors stretching, ibuprofen                               [facility] Adult PT Treatment/Exercise - 12/29/20 0001       Exercises   Exercises Knee/Hip;Lumbar      Lumbar Exercises: Stretches   Active Hamstring Stretch 3 reps;Left;Right;20 seconds    Piriformis Stretch 3 reps;20 seconds;Left;Right    Piriformis Stretch Limitations seated      Lumbar Exercises: Quadruped   Madcat/Old Horse 10 reps    Other Quadruped Lumbar Exercises childs pose and lateral childs pose      Manual  Therapy   Manual Therapy Soft tissue mobilization;Myofascial release    Manual therapy comments elongation and release to bil lumbar paraspinals    Soft tissue mobilization skilled palpation and monitoring during dry needling              Trigger Point Dry Needling - 12/29/20 0001     Consent Given? Yes    Education Handout Provided Yes    Muscles Treated Back/Hip Lumbar multifidi    Lumbar multifidi Response Twitch response elicited;Palpable increased muscle length                  PT Education - 12/29/20 0907     Education Details Access Code: [email or web address], DN info    Person(s) Educated Patient    Methods Explanation;Demonstration;Handout    Comprehension Verbalized understanding;Returned demonstration              PT Short Term Goals - 12/23/20 0928       PT SHORT TERM GOAL #1   Title Patient will be independent with HEP for continued progression at home.    Time 4    Period Weeks    Status New    Target Date 01/20/21  PT SHORT TERM GOAL #2   Title Patient will demonstrate 85 degrees lumbar flexion and 22 degrees lumbar extension to indicate improved mobility for work duties.    Baseline flexion 63, extension 16    Time 4    Period Weeks    Status New    Target Date 01/20/21      PT SHORT TERM GOAL #3   Title Patient will appropriately demonstrate core activation by completing x5 sit to stands without use of valsalva manuever.    Time 4    Period Weeks    Status New    Target Date 01/20/21               PT Long Term Goals - 12/23/20 0943       PT LONG TERM GOAL #1   Title Patient will be independent with advanced HEP for long term management of symptoms post D/C.    Time 8    Period Weeks    Status New    Target Date 02/17/21      PT LONG TERM GOAL #2   Title Patient will score >/= 57 on FOTO to indicate improved overall function.    Baseline 40    Time 8    Period Weeks    Status New    Target Date 02/17/21      PT LONG  TERM GOAL #3   Title Patient will report no more than 3/10 pain for two consecutive weeks indicating progression towards resolution of symptoms.    Baseline 7/10    Time 8    Period Weeks    Status New    Target Date 02/17/21                   Plan - 12/29/20 0932     Clinical Impression Statement First time follow-up after evaluation.  Pt has been performing HEP and demonstrated all aspects correctly.  Minimal tactile cues for cat/cow to isolate thoracic spinal segments.  Pt demonstrated good response to dry needling with twitch response and improved tissue mobility after dry needling and manual therapy today.  Pt demonstrates significant hip and lumbar stiffness and will continue to benefit from skilled PT to address chronic pain, core weakness and lumbopelvic strength and flexibility.    PT Frequency 1x / week    PT Duration 8 weeks    PT Treatment/Interventions ADLs/Self Care Home Management;Aquatic Therapy;Cryotherapy;Electrical Stimulation;Iontophoresis 4mg /ml Dexamethasone;Moist Heat;Gait training;Functional mobility training;Stair training;Therapeutic activities;Therapeutic exercise;Balance training;Patient/family education;Manual techniques;Dry needling;Passive range of motion;Taping;Spinal Manipulations;Joint Manipulations    PT Next Visit Plan assess response to dry needling, begin gentle core strengthening; focus on breathing with exertion; global hip/lumbar mobility    PT Home Exercise Plan Access Code: [email or web address]    Consulted and Agree with Plan of Care Patient             Patient will benefit from skilled therapeutic intervention in order to improve the following deficits and impairments:  Abnormal gait, Decreased activity tolerance, Decreased range of motion, Difficulty walking, Hypomobility, Impaired flexibility, Increased muscle spasms, Increased fascial restricitons, Improper body mechanics, Postural dysfunction, Pain  Visit Diagnosis: Chronic midline low back  pain without sciatica  Cramp and spasm  Abnormal posture     Problem List There are no problems to display for this patient.  , PT 12/29/20 9:34 AM   [facility] [address] [place], [place], [place] Phone: [phone]   Fax:  [phone]  Name: Philip Williamson MRN: [id number] Date of Birth: July 30, 1980

## 2020-12-29 NOTE — Patient Instructions (Addendum)
Trigger Point Dry Needling  What is Trigger Point Dry Needling (DN)? DN is a physical therapy technique used to treat muscle pain and dysfunction. Specifically, DN helps deactivate muscle trigger points (muscle knots).  A thin filiform needle is used to penetrate the skin and stimulate the underlying trigger point. The goal is for a local twitch response (LTR) to occur and for the trigger point to relax. No medication of any kind is injected during the procedure.   What Does Trigger Point Dry Needling Feel Like?  The procedure feels different for each individual patient. Some patients report that they do not actually feel the needle enter the skin and overall the process is not painful. Very mild bleeding may occur. However, many patients feel a deep cramping in the muscle in which the needle was inserted. This is the local twitch response.   How Will I feel after the treatment? Soreness is normal, and the onset of soreness may not occur for a few hours. Typically this soreness does not last longer than two days.  Bruising is uncommon, however; ice can be used to decrease any possible bruising.  In rare cases feeling tired or nauseous after the treatment is normal. In addition, your symptoms may get worse before they get better, this period will typically not last longer than 24 hours.   What Can I do After My Treatment? Increase your hydration by drinking more water for the next 24 hours. You may place ice or heat on the areas treated that have become sore, however, do not use heat on inflamed or bruised areas. Heat often brings more relief post needling. You can continue your regular activities, but vigorous activity is not recommended initially after the treatment for 24 hours. DN is best combined with other physical therapy such as strengthening, stretching, and other therapies.  Access Code: [email or web address] URL: [email or web address] Date: 12/29/2020 Prepared by:  Tresa Endo  Exercises  Supine Piriformis Stretch with Leg Straight - 2 x daily - 7 x weekly - 1 sets - 2 reps - 20s hold Seated Hamstring Stretch - 2 x daily - 7 x weekly - 1 sets - 3 reps - 20 hold Seated Figure 4 Piriformis Stretch - 2 x daily - 7 x weekly - 1 sets - 3 reps - 20 hold    [facility] [address], [address] [place], [place] [address] Phone # [phone] Fax [phone]

## 2021-01-05 ENCOUNTER — Other Ambulatory Visit: Payer: Self-pay

## 2021-01-05 ENCOUNTER — Ambulatory Visit: Payer: BC Managed Care – PPO | Admitting: Physical Therapy

## 2021-01-05 DIAGNOSIS — R252 Cramp and spasm: Secondary | ICD-10-CM

## 2021-01-05 DIAGNOSIS — M545 Low back pain, unspecified: Secondary | ICD-10-CM | POA: Diagnosis not present

## 2021-01-05 DIAGNOSIS — G8929 Other chronic pain: Secondary | ICD-10-CM | POA: Diagnosis not present

## 2021-01-05 DIAGNOSIS — R293 Abnormal posture: Secondary | ICD-10-CM

## 2021-01-05 NOTE — Therapy (Signed)
[facility] [address], [address] [place], [place], [address] Phone: [phone]   Fax:  [phone]  Physical Therapy Treatment  Patient Details  Name: Philip Williamson MRN: [id number] Date of Birth: [birth year] Referring Provider (PT): Naomie Dean, MD   Encounter Date: 01/05/2021   PT End of Session - 01/05/21 1005     Visit Number 3    Date for PT Re-Evaluation 02/17/21    Authorization Type BCBS    PT Start Time 0807    PT Stop Time 0845    PT Time Calculation (min) 38 min    Activity Tolerance Patient tolerated treatment well    Behavior During Therapy [facility] for tasks assessed/performed             No past medical history on file.  Past Surgical History:  Procedure Laterality Date   I & D EXTREMITY Right 12/23/2013   Procedure: IRRIGATION AND DEBRIDEMENT Right Thumb with possible skin graft;  Surgeon: Dominica Severin, MD;  Location: [facility] OR;  Service: Orthopedics;  Laterality: Right;    There were no vitals filed for this visit.   Subjective Assessment - 01/05/21 1001     Subjective Patient reports relief of symptoms following dry needling last session. Has not been as compliant with HEP.    Limitations Walking    Currently in Pain? Yes    Pain Score 4     Pain Location Back    Pain Orientation Right;Left;Lower    Pain Descriptors / Indicators Tightness;Dull    Pain Onset More than a month ago                [facility] PT Assessment - 01/05/21 0001       AROM   Lumbar - Left Side Bend 80%    Lumbar - Right Rotation Not limited    Lumbar - Left Rotation 80%                           [facility] Adult PT Treatment/Exercise - 01/05/21 0001       Lumbar Exercises: Stretches   Active Hamstring Stretch Right;Left;1 rep;30 seconds    Active Hamstring Stretch Limitations seated    Single Knee to Chest Stretch Right;Left;1 rep;30 seconds    Piriformis Stretch Right;Left;1 rep;30 seconds     Piriformis Stretch Limitations seated and supine    Figure 4 Stretch --    Figure 4 Stretch Limitations --      Lumbar Exercises: Aerobic   Elliptical --      Lumbar Exercises: Standing   Other Standing Lumbar Exercises --    Other Standing Lumbar Exercises --      Lumbar Exercises: Seated   Long Arc Quad on [place] --    Other Seated Lumbar Exercises --    Other Seated Lumbar Exercises --      Lumbar Exercises: Supine   Ab Set 10 reps;5 seconds    Pelvic Tilt 10 reps;5 seconds      Lumbar Exercises: Sidelying   Clam --    Clam Limitations --      Lumbar Exercises: Quadruped   Madcat/Old Horse 10 reps    Other Quadruped Lumbar Exercises childs pose and lateral childs pose      Manual Therapy   Manual Therapy Soft tissue mobilization;Myofascial release    Manual therapy comments elongation and release to bil lumbar paraspinals    Soft tissue mobilization skilled palpation and  assessment of tissues before, during, and after dry needling              Trigger Point Dry Needling - 01/05/21 0001     Consent Given? Yes    Education Handout Provided Previously provided    Muscles Treated Back/Hip Lumbar multifidi    Lumbar multifidi Response Palpable increased muscle length                    PT Short Term Goals - 01/05/21 1005       PT SHORT TERM GOAL #3   Title Patient will appropriately demonstrate core activation by completing x5 sit to stands without use of valsalva manuever.    Time 4    Period Weeks    Status On-going    Target Date 01/20/21               PT Long Term Goals - 12/23/20 0943       PT LONG TERM GOAL #1   Title Patient will be independent with advanced HEP for long term management of symptoms post D/C.    Time 8    Period Weeks    Status New    Target Date 02/17/21      PT LONG TERM GOAL #2   Title Patient will score >/= 57 on FOTO to indicate improved overall function.    Baseline 40    Time 8    Period Weeks     Status New    Target Date 02/17/21      PT LONG TERM GOAL #3   Title Patient will report no more than 3/10 pain for two consecutive weeks indicating progression towards resolution of symptoms.    Baseline 7/10    Time 8    Period Weeks    Status New    Target Date 02/17/21                   Plan - 01/05/21 1002     Clinical Impression Statement Cuing provided for decreased valsalva manuever when performing pelvic tilt and ab set exercise. He demonstrates improved segmental mobility with cat/cow exercise. Patient demonstrates full lumbar AROM with regards to rotation. He would benefit from continued skilled therapeutic intervention for decreased pain and improved activity tolerance.    Personal Factors and Comorbidities Time since onset of injury/illness/exacerbation;Comorbidity 1    Comorbidities sleep apnea    Examination-Activity Limitations Locomotion Level    Examination-Participation Restrictions Occupation    PT Treatment/Interventions ADLs/Self Care Home Management;Aquatic Therapy;Cryotherapy;Electrical Stimulation;Iontophoresis 4mg /ml Dexamethasone;Moist Heat;Gait training;Functional mobility training;Stair training;Therapeutic activities;Therapeutic exercise;Balance training;Patient/family education;Manual techniques;Dry needling;Passive range of motion;Taping;Spinal Manipulations;Joint Manipulations    PT Next Visit Plan assess response to dry needling #2, continue gentle core strengthening; focus on breathing with exertion; global hip/lumbar mobility    PT Home Exercise Plan Access Code: [email or web address]    Consulted and Agree with Plan of Care Patient             Patient will benefit from skilled therapeutic intervention in order to improve the following deficits and impairments:  Abnormal gait, Decreased activity tolerance, Decreased range of motion, Difficulty walking, Hypomobility, Impaired flexibility, Increased muscle spasms, Increased fascial restricitons, Improper  body mechanics, Postural dysfunction, Pain  Visit Diagnosis: Chronic midline low back pain without sciatica  Cramp and spasm  Abnormal posture     Problem List There are no problems to display for this patient.   PT, DPT  01/05/21 10:07 AM  [facility] [address], [address] [place], [place], [address] Phone: [phone]   Fax:  [phone]  Name: Philip Williamson MRN: [id number] Date of Birth: 28-Dec-1980

## 2021-01-11 DIAGNOSIS — E611 Iron deficiency: Secondary | ICD-10-CM | POA: Diagnosis not present

## 2021-01-11 DIAGNOSIS — E538 Deficiency of other specified B group vitamins: Secondary | ICD-10-CM | POA: Diagnosis not present

## 2021-01-11 DIAGNOSIS — R110 Nausea: Secondary | ICD-10-CM | POA: Diagnosis not present

## 2021-01-11 DIAGNOSIS — M62838 Other muscle spasm: Secondary | ICD-10-CM | POA: Diagnosis not present

## 2021-01-11 DIAGNOSIS — R399 Unspecified symptoms and signs involving the genitourinary system: Secondary | ICD-10-CM | POA: Diagnosis not present

## 2021-01-11 DIAGNOSIS — Z125 Encounter for screening for malignant neoplasm of prostate: Secondary | ICD-10-CM | POA: Diagnosis not present

## 2021-01-11 DIAGNOSIS — R519 Headache, unspecified: Secondary | ICD-10-CM | POA: Diagnosis not present

## 2021-01-11 DIAGNOSIS — R5382 Chronic fatigue, unspecified: Secondary | ICD-10-CM | POA: Diagnosis not present

## 2021-01-11 DIAGNOSIS — R42 Dizziness and giddiness: Secondary | ICD-10-CM | POA: Diagnosis not present

## 2021-01-11 DIAGNOSIS — G4730 Sleep apnea, unspecified: Secondary | ICD-10-CM | POA: Diagnosis not present

## 2021-01-12 ENCOUNTER — Other Ambulatory Visit: Payer: Self-pay

## 2021-01-12 ENCOUNTER — Ambulatory Visit: Payer: BC Managed Care – PPO | Admitting: Physical Therapy

## 2021-01-12 DIAGNOSIS — M545 Low back pain, unspecified: Secondary | ICD-10-CM | POA: Diagnosis not present

## 2021-01-12 DIAGNOSIS — R293 Abnormal posture: Secondary | ICD-10-CM

## 2021-01-12 DIAGNOSIS — G8929 Other chronic pain: Secondary | ICD-10-CM | POA: Diagnosis not present

## 2021-01-12 DIAGNOSIS — R252 Cramp and spasm: Secondary | ICD-10-CM

## 2021-01-12 NOTE — Therapy (Signed)
[facility] [address], [address] [place], [place], [address] Phone: [phone]   Fax:  [phone]  Physical Therapy Treatment  Patient Details  Name: Philip Williamson MRN: [id number] Date of Birth: [birth year] Referring Provider (PT): Naomie Dean, MD   Encounter Date: 01/12/2021   PT End of Session - 01/12/21 0851     Visit Number 4    Date for PT Re-Evaluation 02/17/21    Authorization Type BCBS    PT Start Time 0807    PT Stop Time 0845    PT Time Calculation (min) 38 min    Activity Tolerance Patient tolerated treatment well    Behavior During Therapy [facility] for tasks assessed/performed             No past medical history on file.  Past Surgical History:  Procedure Laterality Date   I & D EXTREMITY Right 12/23/2013   Procedure: IRRIGATION AND DEBRIDEMENT Right Thumb with possible skin graft;  Surgeon: Dominica Severin, MD;  Location: [facility] OR;  Service: Orthopedics;  Laterality: Right;    There were no vitals filed for this visit.   Subjective Assessment - 01/12/21 0848     Subjective "I feel pretty good minus a little stiffness"    Limitations Walking    Currently in Pain? Yes    Pain Location Back    Pain Orientation Lower    Pain Descriptors / Indicators Tightness    Pain Type Chronic pain    Pain Onset More than a month ago    Pain Frequency Constant                               [facility] Adult PT Treatment/Exercise - 01/12/21 0001       Lumbar Exercises: Stretches   Piriformis Stretch Right;Left;1 rep;30 seconds    Piriformis Stretch Limitations supine    Other Lumbar Stretch Exercise open books with foam roll; 5 x 10s hold Lt/Rt      Lumbar Exercises: Aerobic   Elliptical cross ramp 1; L5 x 5 min      Lumbar Exercises: Standing   Other Standing Lumbar Exercises anti rotation press, green tband, 2 x 15 reps each direction    Other Standing Lumbar Exercises power tower resisted  walking, 25#, x3 trips each direction      Lumbar Exercises: Supine   Ab Set 10 reps;5 seconds    AB Set Limitations with march; 5 sets of 10 marches      Lumbar Exercises: Quadruped   Madcat/Old Horse 10 reps    Other Quadruped Lumbar Exercises child's pose; 5 x 10s hold                      PT Short Term Goals - 01/12/21 0850       PT SHORT TERM GOAL #1   Title Patient will be independent with HEP for continued progression at home.    Time 4    Period Weeks    Status On-going    Target Date 01/20/21               PT Long Term Goals - 12/23/20 0943       PT LONG TERM GOAL #1   Title Patient will be independent with advanced HEP for long term management of symptoms post D/C.    Time 8    Period Weeks    Status  New    Target Date 02/17/21      PT LONG TERM GOAL #2   Title Patient will score >/= 57 on FOTO to indicate improved overall function.    Baseline 40    Time 8    Period Weeks    Status New    Target Date 02/17/21      PT LONG TERM GOAL #3   Title Patient will report no more than 3/10 pain for two consecutive weeks indicating progression towards resolution of symptoms.    Baseline 7/10    Time 8    Period Weeks    Status New    Target Date 02/17/21                   Plan - 01/12/21 0848     Clinical Impression Statement Patient continues to report partial compliance with HEP but has significantly reduced low back pain this date. He demonstrates improved core recruitment as patient requiring no cuing to initiate ab set. Verbal cuing for continued breathing when performing ab set with march. He exhibits improved segmental mobility with cat/cow.    Personal Factors and Comorbidities Time since onset of injury/illness/exacerbation;Comorbidity 1    Comorbidities sleep apnea    Examination-Activity Limitations Locomotion Level    Examination-Participation Restrictions Occupation    Rehab Potential Excellent    PT Frequency 1x /  week    PT Duration 8 weeks    PT Treatment/Interventions ADLs/Self Care Home Management;Aquatic Therapy;Cryotherapy;Electrical Stimulation;Iontophoresis 4mg /ml Dexamethasone;Moist Heat;Gait training;Functional mobility training;Stair training;Therapeutic activities;Therapeutic exercise;Balance training;Patient/family education;Manual techniques;Dry needling;Passive range of motion;Taping;Spinal Manipulations;Joint Manipulations    PT Next Visit Plan progress core strength; add functional lifts and carries with focus on breathing with exertion; global hip/lumbar mobility    PT Home Exercise Plan Access Code: [email or web address]    Consulted and Agree with Plan of Care Patient             Patient will benefit from skilled therapeutic intervention in order to improve the following deficits and impairments:  Abnormal gait, Decreased activity tolerance, Decreased range of motion, Difficulty walking, Hypomobility, Impaired flexibility, Increased muscle spasms, Increased fascial restricitons, Improper body mechanics, Postural dysfunction, Pain  Visit Diagnosis: Chronic midline low back pain without sciatica  Cramp and spasm  Abnormal posture     Problem List There are no problems to display for this patient.   PT, DPT  01/12/21 8:52 AM   [facility] [address] [place], [place], [place] Phone: [phone]   Fax:  [phone]  Name: Philip Williamson MRN: Philip Williamson Date of Birth: 15-Oct-1980

## 2021-01-19 ENCOUNTER — Encounter: Payer: BC Managed Care – PPO | Admitting: Physical Therapy

## 2021-01-20 ENCOUNTER — Ambulatory Visit: Payer: BC Managed Care – PPO

## 2021-01-25 ENCOUNTER — Encounter: Payer: BC Managed Care – PPO | Admitting: Physical Therapy

## 2021-02-02 ENCOUNTER — Other Ambulatory Visit: Payer: Self-pay

## 2021-02-02 ENCOUNTER — Ambulatory Visit: Payer: BC Managed Care – PPO | Attending: Family Medicine | Admitting: Physical Therapy

## 2021-02-02 DIAGNOSIS — G8929 Other chronic pain: Secondary | ICD-10-CM | POA: Diagnosis not present

## 2021-02-02 DIAGNOSIS — R252 Cramp and spasm: Secondary | ICD-10-CM | POA: Insufficient documentation

## 2021-02-02 DIAGNOSIS — M545 Low back pain, unspecified: Secondary | ICD-10-CM | POA: Insufficient documentation

## 2021-02-02 DIAGNOSIS — R293 Abnormal posture: Secondary | ICD-10-CM | POA: Diagnosis not present

## 2021-02-02 NOTE — Therapy (Addendum)
[facility] [address] [place], [place], [address] Phone: [phone]   Fax:  [phone]  Physical Therapy Treatment  Patient Details  Name: Philip Williamson MRN: [id number] Date of Birth: Apr 12, 1981 Referring Provider (PT): Sarina Ill, MD   Encounter Date: 02/02/2021   PT End of Session - 02/02/21 0842     Visit Number 5    Date for PT Re-Evaluation 02/17/21    Authorization Type BCBS    PT Start Time 0806    PT Stop Time 0845    PT Time Calculation (min) 39 min             No past medical history on file.  Past Surgical History:  Procedure Laterality Date   I & D EXTREMITY Right 12/23/2013   Procedure: IRRIGATION AND DEBRIDEMENT Right Thumb with possible skin graft;  Surgeon: Roseanne Kaufman, MD;  Location: [place];  Service: Orthopedics;  Laterality: Right;    There were no vitals filed for this visit.   Subjective Assessment - 02/02/21 0809     Subjective Has had 9/10 pain at work. Pain is 3/10 today. Felt that he was progressing but having had to cancel his appointments the past two weeks has caused slight regression. Still feels improved overall.    Limitations Walking    Currently in Pain? Yes    Pain Score 3     Pain Location Back    Pain Orientation Lower    Pain Descriptors / Indicators Tightness;Sore    Pain Type Chronic pain    Pain Onset More than a month ago                               [facility] Adult PT Treatment/Exercise - 02/02/21 0001       Lumbar Exercises: Stretches   Lower Trunk Rotation 3 reps;10 seconds    Lower Trunk Rotation Limitations Lt/Rt    Piriformis Stretch Right;Left;1 rep;30 seconds    Piriformis Stretch Limitations seated    Other Lumbar Stretch Exercise open books with foam roll; 3 x 10s hold Lt/Rt    Other Lumbar Stretch Exercise seated ball roll into flexion x10; with Lt/Rt reach x3      Lumbar Exercises: Quadruped   Madcat/Old Horse 10  reps    Opposite Arm/Leg Raise Right arm/Left leg;Left arm/Right leg;10 reps;2 seconds    Opposite Arm/Leg Raise Limitations cuing for decreased trunk rotation and TA activation      Manual Therapy   Soft tissue mobilization skilled palpation and assessment of tissues before, during, and after dry needling              Trigger Point Dry Needling - 02/02/21 0001     Consent Given? Yes    Education Handout Provided Previously provided    Muscles Treated Back/Hip Lumbar multifidi;Erector spinae    Electrical Stimulation Performed with Dry Needling Yes    E-stim with Dry Needling Details Lt/Rt    Erector spinae Response Palpable increased muscle length    Lumbar multifidi Response Twitch response elicited                    PT Short Term Goals - 02/02/21 0842       PT SHORT TERM GOAL #1   Title Patient will be independent with HEP for continued progression at home.    Time 4    Period Weeks  Status Achieved   independent but not regularly compliant   Target Date 01/20/21      PT SHORT TERM GOAL #2   Title Patient will demonstrate 85 degrees lumbar flexion and 22 degrees lumbar extension to indicate improved mobility for work duties.    Baseline flexion 63, extension 16    Time 4    Period Weeks    Status On-going    Target Date 01/20/21      PT SHORT TERM GOAL #3   Title Patient will appropriately demonstrate core activation by completing x5 sit to stands without use of valsalva manuever.    Time 4    Period Weeks    Status On-going               PT Long Term Goals - 02/02/21 0841       PT LONG TERM GOAL #1   Title Patient will be independent with advanced HEP for long term management of symptoms post D/C.    Time 8    Period Weeks    Status On-going      PT LONG TERM GOAL #2   Title Patient will score >/= 57 on FOTO to indicate improved overall function.    Baseline 40    Time 8    Period Weeks    Status On-going      PT LONG TERM GOAL  #3   Title Patient will report no more than 3/10 pain for two consecutive weeks indicating progression towards resolution of symptoms.    Baseline 7/10    Time 8    Period Weeks    Status Achieved                   Plan - 02/02/21 6222     Clinical Impression Statement Patient requiring intermittent cues for deep breathing during session as he frequently resorts to shallow breathing with all mobility. Continues to demonstrate core strength impairments as patient having increaesed difficulty maintaining neutral lumbar spine when performing "bird dog" exercise. Noting improved trunk/lower body dissociation while ambulating at end of session.    Personal Factors and Comorbidities Time since onset of injury/illness/exacerbation;Comorbidity 1    Comorbidities sleep apnea    Examination-Activity Limitations Locomotion Level    Examination-Participation Restrictions Occupation    Rehab Potential Excellent    PT Frequency 1x / week    PT Duration 8 weeks    PT Treatment/Interventions ADLs/Self Care Home Management;Aquatic Therapy;Cryotherapy;Electrical Stimulation;Iontophoresis 75m/ml Dexamethasone;Moist Heat;Gait training;Functional mobility training;Stair training;Therapeutic activities;Therapeutic exercise;Balance training;Patient/family education;Manual techniques;Dry needling;Passive range of motion;Taping;Spinal Manipulations;Joint Manipulations    PT Next Visit Plan continue to progress core and add functional lifts and carries to patient tolerance; continue breath training; global hip/lumbar mobility    PT Home Exercise Plan Access Code: [email or web address]   Consulted and Agree with Plan of Care Patient             Patient will benefit from skilled therapeutic intervention in order to improve the following deficits and impairments:  Abnormal gait, Decreased activity tolerance, Decreased range of motion, Difficulty walking, Hypomobility, Impaired flexibility, Increased muscle spasms,  Increased fascial restricitons, Improper body mechanics, Postural dysfunction, Pain  Visit Diagnosis: Chronic midline low back pain without sciatica  Cramp and spasm  Abnormal posture     Problem List There are no problems to display for this patient.   [name] AllPT, DPT  02/02/21 8:51 AM PHYSICAL THERAPY DISCHARGE SUMMARY  Visits from Start of Care: 5  Current functional level related to goals / functional outcomes: See above for current status.  Pt didn't return to PT due to change in medical status.     Remaining deficits: See above.    Education / Equipment: HEP   Patient agrees to discharge. Patient goals were not met. Patient is being discharged due to a change in medical status.  Sigurd Sos, PT 04/05/21 7:16 PM    [facility] [address], [place], [place], [address] Phone: [phone]   Fax:  [phone]  Name: Philip Williamson MRN: [id number] Date of Birth: 05-13-1981

## 2021-02-08 ENCOUNTER — Ambulatory Visit: Payer: BC Managed Care – PPO | Admitting: Physical Therapy

## 2021-02-09 ENCOUNTER — Emergency Department (HOSPITAL_COMMUNITY): Payer: BC Managed Care – PPO

## 2021-02-09 ENCOUNTER — Other Ambulatory Visit: Payer: Self-pay

## 2021-02-09 ENCOUNTER — Emergency Department (HOSPITAL_COMMUNITY)
Admission: EM | Admit: 2021-02-09 | Discharge: 2021-02-09 | Disposition: A | Discharge status: Home / Self Care | Payer: BC Managed Care – PPO | Attending: Emergency Medicine | Admitting: Emergency Medicine

## 2021-02-09 ENCOUNTER — Encounter: Payer: BC Managed Care – PPO | Admitting: Physical Therapy

## 2021-02-09 DIAGNOSIS — T148XXA Other injury of unspecified body region, initial encounter: Secondary | ICD-10-CM

## 2021-02-09 DIAGNOSIS — S2241XA Multiple fractures of ribs, right side, initial encounter for closed fracture: Secondary | ICD-10-CM

## 2021-02-09 DIAGNOSIS — S60221A Contusion of right hand, initial encounter: Secondary | ICD-10-CM | POA: Insufficient documentation

## 2021-02-09 DIAGNOSIS — S6992XA Unspecified injury of left wrist, hand and finger(s), initial encounter: Secondary | ICD-10-CM | POA: Diagnosis not present

## 2021-02-09 DIAGNOSIS — S80212A Abrasion, left knee, initial encounter: Secondary | ICD-10-CM | POA: Diagnosis not present

## 2021-02-09 DIAGNOSIS — M25562 Pain in left knee: Secondary | ICD-10-CM | POA: Diagnosis not present

## 2021-02-09 DIAGNOSIS — S60222A Contusion of left hand, initial encounter: Secondary | ICD-10-CM | POA: Insufficient documentation

## 2021-02-09 DIAGNOSIS — S50812A Abrasion of left forearm, initial encounter: Secondary | ICD-10-CM | POA: Diagnosis not present

## 2021-02-09 DIAGNOSIS — S80211A Abrasion, right knee, initial encounter: Secondary | ICD-10-CM | POA: Diagnosis not present

## 2021-02-09 DIAGNOSIS — Z23 Encounter for immunization: Secondary | ICD-10-CM | POA: Insufficient documentation

## 2021-02-09 DIAGNOSIS — S0990XA Unspecified injury of head, initial encounter: Secondary | ICD-10-CM | POA: Insufficient documentation

## 2021-02-09 DIAGNOSIS — S60512A Abrasion of left hand, initial encounter: Secondary | ICD-10-CM | POA: Diagnosis not present

## 2021-02-09 DIAGNOSIS — S50811A Abrasion of right forearm, initial encounter: Secondary | ICD-10-CM | POA: Diagnosis not present

## 2021-02-09 DIAGNOSIS — Y92410 Unspecified street and highway as the place of occurrence of the external cause: Secondary | ICD-10-CM | POA: Diagnosis not present

## 2021-02-09 DIAGNOSIS — M25531 Pain in right wrist: Secondary | ICD-10-CM | POA: Diagnosis not present

## 2021-02-09 DIAGNOSIS — M542 Cervicalgia: Secondary | ICD-10-CM | POA: Diagnosis not present

## 2021-02-09 DIAGNOSIS — S299XXA Unspecified injury of thorax, initial encounter: Secondary | ICD-10-CM | POA: Insufficient documentation

## 2021-02-09 DIAGNOSIS — Z041 Encounter for examination and observation following transport accident: Secondary | ICD-10-CM | POA: Diagnosis not present

## 2021-02-09 DIAGNOSIS — S3993XA Unspecified injury of pelvis, initial encounter: Secondary | ICD-10-CM | POA: Diagnosis not present

## 2021-02-09 DIAGNOSIS — S3991XA Unspecified injury of abdomen, initial encounter: Secondary | ICD-10-CM | POA: Diagnosis not present

## 2021-02-09 DIAGNOSIS — S60511A Abrasion of right hand, initial encounter: Secondary | ICD-10-CM | POA: Diagnosis not present

## 2021-02-09 DIAGNOSIS — Y900 Blood alcohol level of less than 20 mg/100 ml: Secondary | ICD-10-CM | POA: Insufficient documentation

## 2021-02-09 DIAGNOSIS — I700 Atherosclerosis of aorta: Secondary | ICD-10-CM | POA: Diagnosis not present

## 2021-02-09 DIAGNOSIS — M25532 Pain in left wrist: Secondary | ICD-10-CM | POA: Diagnosis not present

## 2021-02-09 LAB — I-STAT CHEM 8, ED
BUN: 10 mg/dL (ref 6–20)
Calcium, Ion: 1.13 mmol/L — ABNORMAL LOW (ref 1.15–1.40)
Chloride: 106 mmol/L (ref 98–111)
Creatinine, Ser: 1.1 mg/dL (ref 0.61–1.24)
Glucose, Bld: 106 mg/dL — ABNORMAL HIGH (ref 70–99)
HCT: 40 % (ref 39.0–52.0)
Hemoglobin: 13.6 g/dL (ref 13.0–17.0)
Potassium: 3.7 mmol/L (ref 3.5–5.1)
Sodium: 140 mmol/L (ref 135–145)
TCO2: 24 mmol/L (ref 22–32)

## 2021-02-09 LAB — CBC
HCT: 39.6 % (ref 39.0–52.0)
Hemoglobin: 13.6 g/dL (ref 13.0–17.0)
MCH: 30.9 pg (ref 26.0–34.0)
MCHC: 34.3 g/dL (ref 30.0–36.0)
MCV: 90 fL (ref 80.0–100.0)
Platelets: 146 10*3/uL — ABNORMAL LOW (ref 150–400)
RBC: 4.4 MIL/uL (ref 4.22–5.81)
RDW: 13 % (ref 11.5–15.5)
WBC: 6.8 10*3/uL (ref 4.0–10.5)
nRBC: 0 % (ref 0.0–0.2)

## 2021-02-09 LAB — COMPREHENSIVE METABOLIC PANEL
ALT: 21 U/L (ref 0–44)
AST: 22 U/L (ref 15–41)
Albumin: 3.8 g/dL (ref 3.5–5.0)
Alkaline Phosphatase: 63 U/L (ref 38–126)
Anion gap: 9 (ref 5–15)
BUN: 10 mg/dL (ref 6–20)
CO2: 24 mmol/L (ref 22–32)
Calcium: 9 mg/dL (ref 8.9–10.3)
Chloride: 106 mmol/L (ref 98–111)
Creatinine, Ser: 1.22 mg/dL (ref 0.61–1.24)
GFR, Estimated: >60 mL/min (ref >60)
Glucose, Bld: 104 mg/dL — ABNORMAL HIGH (ref 70–99)
Potassium: 3.7 mmol/L (ref 3.5–5.1)
Sodium: 139 mmol/L (ref 135–145)
Total Bilirubin: 0.7 mg/dL (ref 0.3–1.2)
Total Protein: 6.8 g/dL (ref 6.5–8.1)

## 2021-02-09 LAB — PROTIME-INR
INR: 1 (ref 0.8–1.2)
Prothrombin Time: 13.3 seconds (ref 11.4–15.2)

## 2021-02-09 LAB — ETHANOL: Alcohol, Ethyl (B): <10 mg/dL (ref <10)

## 2021-02-09 MED ORDER — SODIUM CHLORIDE 0.9 % IV BOLUS
1000.0000 mL | Freq: Once | INTRAVENOUS | Status: AC
  Administered 2021-02-09: 1000 mL via INTRAVENOUS

## 2021-02-09 MED ORDER — IOHEXOL 350 MG/ML SOLN
100.0000 mL | Freq: Once | INTRAVENOUS | Status: AC | PRN
  Administered 2021-02-09: 100 mL via INTRAVENOUS

## 2021-02-09 MED ORDER — ONDANSETRON HCL 4 MG/2ML IJ SOLN: 4.0000 mg | Freq: Once | INTRAMUSCULAR | Status: DC

## 2021-02-09 MED ORDER — TETANUS-DIPHTH-ACELL PERTUSSIS 5-2.5-18.5 LF-MCG/0.5 IM SUSY
0.5000 mL | PREFILLED_SYRINGE | Freq: Once | INTRAMUSCULAR | Status: AC
  Administered 2021-02-09: 0.5 mL via INTRAMUSCULAR
  Filled 2021-02-09: qty 0.5

## 2021-02-09 MED ORDER — SULFAMETHOXAZOLE-TRIMETHOPRIM 800-160 MG PO TABS: 1.0000 | ORAL_TABLET | Freq: Two times a day (BID) | ORAL | 0 refills | Status: AC

## 2021-02-09 MED ORDER — CEFAZOLIN SODIUM-DEXTROSE 2-4 GM/100ML-% IV SOLN
2.0000 g | Freq: Once | INTRAVENOUS | Status: AC
  Administered 2021-02-09: 2 g via INTRAVENOUS
  Filled 2021-02-09: qty 100

## 2021-02-09 MED ORDER — MORPHINE SULFATE (PF) 4 MG/ML IV SOLN: 4.0000 mg | Freq: Once | INTRAVENOUS | Status: DC

## 2021-02-09 MED ORDER — HYDROCODONE-ACETAMINOPHEN 5-325 MG PO TABS: 1.0000 | ORAL_TABLET | Freq: Four times a day (QID) | ORAL | 0 refills | Status: DC | PRN
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
## 2021-02-10 LAB — SAMPLE TO BLOOD BANK

## 2021-02-14 DIAGNOSIS — S50811D Abrasion of right forearm, subsequent encounter: Secondary | ICD-10-CM | POA: Diagnosis not present

## 2021-02-14 DIAGNOSIS — M25532 Pain in left wrist: Secondary | ICD-10-CM | POA: Diagnosis not present

## 2021-02-14 DIAGNOSIS — S80212D Abrasion, left knee, subsequent encounter: Secondary | ICD-10-CM | POA: Diagnosis not present

## 2021-02-14 DIAGNOSIS — M25562 Pain in left knee: Secondary | ICD-10-CM | POA: Diagnosis not present

## 2021-02-24 DIAGNOSIS — M25532 Pain in left wrist: Secondary | ICD-10-CM | POA: Diagnosis not present

## 2021-03-02 DIAGNOSIS — D509 Iron deficiency anemia, unspecified: Secondary | ICD-10-CM | POA: Diagnosis not present

## 2021-03-02 DIAGNOSIS — E785 Hyperlipidemia, unspecified: Secondary | ICD-10-CM | POA: Diagnosis not present

## 2021-03-07 DIAGNOSIS — Z0000 Encounter for general adult medical examination without abnormal findings: Secondary | ICD-10-CM | POA: Diagnosis not present

## 2021-03-07 DIAGNOSIS — Z23 Encounter for immunization: Secondary | ICD-10-CM | POA: Diagnosis not present

## 2021-03-10 DIAGNOSIS — M25532 Pain in left wrist: Secondary | ICD-10-CM | POA: Diagnosis not present

## 2021-03-22 DIAGNOSIS — M25532 Pain in left wrist: Secondary | ICD-10-CM | POA: Diagnosis not present

## 2021-03-22 DIAGNOSIS — M19032 Primary osteoarthritis, left wrist: Secondary | ICD-10-CM | POA: Diagnosis not present

## 2021-03-22 DIAGNOSIS — M1812 Unilateral primary osteoarthritis of first carpometacarpal joint, left hand: Secondary | ICD-10-CM | POA: Diagnosis not present

## 2021-03-24 DIAGNOSIS — M25532 Pain in left wrist: Secondary | ICD-10-CM | POA: Diagnosis not present

## 2021-03-29 ENCOUNTER — Emergency Department (HOSPITAL_COMMUNITY)
Admission: EM | Admit: 2021-03-29 | Discharge: 2021-03-29 | Disposition: A | Discharge status: Home / Self Care | Payer: No Typology Code available for payment source | Attending: Emergency Medicine | Admitting: Emergency Medicine

## 2021-03-29 ENCOUNTER — Encounter (HOSPITAL_COMMUNITY): Payer: Self-pay

## 2021-03-29 ENCOUNTER — Other Ambulatory Visit: Payer: Self-pay

## 2021-03-29 DIAGNOSIS — Z5321 Procedure and treatment not carried out due to patient leaving prior to being seen by health care provider: Secondary | ICD-10-CM | POA: Insufficient documentation

## 2021-03-29 DIAGNOSIS — S61210A Laceration without foreign body of right index finger without damage to nail, initial encounter: Secondary | ICD-10-CM | POA: Diagnosis present

## 2021-03-29 DIAGNOSIS — X58XXXA Exposure to other specified factors, initial encounter: Secondary | ICD-10-CM | POA: Diagnosis not present

## 2021-03-29 NOTE — ED Triage Notes (Signed)
Pt cut his right index finger while working around midnight.

## 2021-03-29 NOTE — ED Notes (Signed)
This patient returned his stickers and stated that he would be leaving the department.

## 2021-06-28 DIAGNOSIS — J0110 Acute frontal sinusitis, unspecified: Secondary | ICD-10-CM | POA: Diagnosis not present
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
## 2022-07-17 DIAGNOSIS — F329 Major depressive disorder, single episode, unspecified: Secondary | ICD-10-CM | POA: Diagnosis not present

## 2022-08-24 DIAGNOSIS — U071 COVID-19: Secondary | ICD-10-CM | POA: Diagnosis not present

## 2022-11-10 DIAGNOSIS — R197 Diarrhea, unspecified: Secondary | ICD-10-CM | POA: Diagnosis not present

## 2022-11-21 ENCOUNTER — Ambulatory Visit
Admission: EM | Admit: 2022-11-21 | Discharge: 2022-11-21 | Disposition: A | Discharge status: Home / Self Care | Payer: BC Managed Care – PPO | Attending: Urgent Care | Admitting: Urgent Care

## 2022-11-21 DIAGNOSIS — S29012A Strain of muscle and tendon of back wall of thorax, initial encounter: Secondary | ICD-10-CM | POA: Diagnosis not present

## 2022-11-21 HISTORY — DX: Depression, unspecified: F32.A

## 2022-11-21 HISTORY — DX: Anxiety disorder, unspecified: F41.9

## 2022-11-21 MED ORDER — PREDNISONE 20 MG PO TABS: ORAL_TABLET | ORAL | 0 refills | Status: DC

## 2022-11-21 MED ORDER — KETOROLAC TROMETHAMINE 30 MG/ML IJ SOLN
30.0000 mg | Freq: Once | INTRAMUSCULAR | Status: AC
  Administered 2022-11-21: 30 mg via INTRAMUSCULAR

## 2022-11-21 MED ORDER — CYCLOBENZAPRINE HCL 5 MG PO TABS: 5.0000 mg | ORAL_TABLET | Freq: Three times a day (TID) | ORAL | 0 refills | Status: DC | PRN
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
## 2022-11-21 NOTE — ED Triage Notes (Signed)
Pt c/o upper/mid back pain started at work last night-denies traumatic injury-reports hx of lower back pain-NAD-slow gait

## 2023-02-12 DIAGNOSIS — Z23 Encounter for immunization: Secondary | ICD-10-CM | POA: Diagnosis not present

## 2023-02-12 DIAGNOSIS — E785 Hyperlipidemia, unspecified: Secondary | ICD-10-CM | POA: Diagnosis not present

## 2023-02-12 DIAGNOSIS — Z0000 Encounter for general adult medical examination without abnormal findings: Secondary | ICD-10-CM | POA: Diagnosis not present

## 2023-02-12 DIAGNOSIS — Z125 Encounter for screening for malignant neoplasm of prostate: Secondary | ICD-10-CM | POA: Diagnosis not present

## 2023-03-09 DIAGNOSIS — E785 Hyperlipidemia, unspecified: Secondary | ICD-10-CM | POA: Diagnosis not present

## 2023-03-09 DIAGNOSIS — L600 Ingrowing nail: Secondary | ICD-10-CM | POA: Diagnosis not present

## 2023-03-15 ENCOUNTER — Ambulatory Visit: Payer: BC Managed Care – PPO | Admitting: Podiatry

## 2023-03-15 ENCOUNTER — Encounter: Payer: Self-pay | Admitting: Podiatry

## 2023-03-15 DIAGNOSIS — F411 Generalized anxiety disorder: Secondary | ICD-10-CM | POA: Insufficient documentation

## 2023-03-15 DIAGNOSIS — F320 Major depressive disorder, single episode, mild: Secondary | ICD-10-CM | POA: Insufficient documentation

## 2023-03-15 DIAGNOSIS — F341 Dysthymic disorder: Secondary | ICD-10-CM | POA: Insufficient documentation

## 2023-03-15 DIAGNOSIS — H52209 Unspecified astigmatism, unspecified eye: Secondary | ICD-10-CM | POA: Insufficient documentation

## 2023-03-15 DIAGNOSIS — F331 Major depressive disorder, recurrent, moderate: Secondary | ICD-10-CM | POA: Insufficient documentation

## 2023-03-15 DIAGNOSIS — L603 Nail dystrophy: Secondary | ICD-10-CM | POA: Diagnosis not present

## 2023-03-15 DIAGNOSIS — L600 Ingrowing nail: Secondary | ICD-10-CM | POA: Diagnosis not present

## 2023-03-15 DIAGNOSIS — Z719 Counseling, unspecified: Secondary | ICD-10-CM | POA: Insufficient documentation

## 2023-03-15 DIAGNOSIS — K219 Gastro-esophageal reflux disease without esophagitis: Secondary | ICD-10-CM | POA: Insufficient documentation

## 2023-03-15 DIAGNOSIS — F458 Other somatoform disorders: Secondary | ICD-10-CM | POA: Insufficient documentation

## 2023-03-15 DIAGNOSIS — B351 Tinea unguium: Secondary | ICD-10-CM | POA: Diagnosis not present

## 2023-03-15 DIAGNOSIS — F17200 Nicotine dependence, unspecified, uncomplicated: Secondary | ICD-10-CM | POA: Insufficient documentation

## 2023-03-15 MED ORDER — TERBINAFINE HCL 250 MG PO TABS: 250.0000 mg | ORAL_TABLET | Freq: Every day | ORAL | 0 refills | Status: DC

## 2023-03-15 MED ORDER — NEOMYCIN-POLYMYXIN-HC 1 % OT SOLN: OTIC | 1 refills | Status: DC

## 2023-03-15 NOTE — Patient Instructions (Signed)

## 2023-03-15 NOTE — Progress Notes (Signed)
Subjective:  Patient ID: Philip Williamson, male    DOB: [birth year],  MRN: [id number] HPI Chief Complaint  Patient presents with   Toe Pain    Hallux right - lateral border, ingrown, red, swollen x 1-2 weeks, fungus concern, PCP eval-Rx'd antibiotics (currently taking), also has a red rash areas of toes on both feet, PCP Rx'd steroid cream for it and it helps some   New Patient (Initial Visit)    42 y.o. male presents with the above complaint.   ROS: Denies fever chills nausea vomit muscle aches pains calf pain back pain chest pain shortness of breath.  States the ingrown toenail is the most painful has had a rash with Asbell antibiotics has come down some use a steroid cream to the toes that breakout as well as the dorsal aspect of the foot.  Some of his toenails are thick and his doctor is concerned about fungus he says.  Past Medical History:  Diagnosis Date   Anxiety    per pt   Depression    per pt   Past Surgical History:  Procedure Laterality Date   I & D EXTREMITY Right 12/23/2013   Procedure: IRRIGATION AND DEBRIDEMENT Right Thumb with possible skin graft;  Surgeon: Dominica Severin, MD;  Location: [facility] OR;  Service: Orthopedics;  Laterality: Right;    Current Outpatient Medications:    mupirocin ointment (BACTROBAN) 2 %, Apply 1 Application topically 3 (three) times daily., Disp: , Rfl:    rosuvastatin (CRESTOR) 10 MG tablet, Take 10 mg by mouth at bedtime., Disp: , Rfl:    sulfamethoxazole-trimethoprim (BACTRIM DS) 800-160 MG tablet, Take 1 tablet by mouth 2 (two) times daily., Disp: , Rfl:    sertraline (ZOLOFT) 50 MG tablet, Take 50 mg by mouth daily., Disp: , Rfl:   No Known Allergies Review of Systems Objective:  There were no vitals filed for this visit.  General: Well developed, nourished, in no acute distress, alert and oriented x3   Dermatological: Skin is warm, dry and supple bilateral. Nails x 10 are well maintained; remaining integument appears unremarkable at  this time. There are no open sores, no preulcerative lesions, no rash or signs of infection present.  It appears that he has tinea pedis around his toes interdigital tinea as well he also has a spot of tinea on the top of the foot has also had it in the past in the armpits.  Hallux nail plate right does demonstrate severe erythema gross granulation tissue all the way around the margin of the toenail.  Vascular: Dorsalis Pedis artery and Posterior Tibial artery pedal pulses are 2/4 bilateral with immedate capillary fill time. Pedal hair growth present. No varicosities and no lower extremity edema present bilateral.   Neruologic: Grossly intact via light touch bilateral. Vibratory intact via tuning fork bilateral. Protective threshold with Semmes Wienstein monofilament intact to all pedal sites bilateral. Patellar and Achilles deep tendon reflexes 2+ bilateral. No Babinski or clonus noted bilateral.   Musculoskeletal: No gross boney pedal deformities bilateral. No pain, crepitus, or limitation noted with foot and ankle range of motion bilateral. Muscular strength 5/5 in all groups tested bilateral.  Gait: Unassisted, Nonantalgic.    Radiographs:  None taken  Assessment & Plan:   Assessment: Painful paronychia ingrown nail hallux right.  Tinea pedis.  Nail dystrophy.  Plan: Samples of the skin and nail were taken today for pathologic evaluation.  Toenail avulsion was performed after local anesthetic was administered tolerated procedure well without complications  after local anesthetic was administered.  Will receive both oral written home-going instructions for care and soaking of the toe as well as a prescription for Cortisporin Otic.  He will also have a prescription for Lamisil therapy 1 p.o. daily for the next month to see if we can clear this tinea.  I will follow-up with him in 2 weeks questions or concerns notify us immediately.     Christina Waldrop T. [place], [place]

## 2023-03-19 ENCOUNTER — Telehealth: Payer: Self-pay | Admitting: Podiatry

## 2023-03-19 ENCOUNTER — Encounter: Payer: Self-pay | Admitting: Podiatry

## 2023-03-19 NOTE — Telephone Encounter (Signed)
Patient called asking for a work note for 03/15/2023. Please call pt and let him know. [phone]

## 2023-03-20 ENCOUNTER — Encounter: Payer: Self-pay | Admitting: Podiatry

## 2023-03-20 NOTE — Telephone Encounter (Signed)
Wrote a note for pt let him know sent to Mychart and printed if he wants to pick up

## 2023-03-29 ENCOUNTER — Ambulatory Visit: Payer: BC Managed Care – PPO | Admitting: Podiatry

## 2023-03-29 DIAGNOSIS — Z9889 Other specified postprocedural states: Secondary | ICD-10-CM

## 2023-03-29 DIAGNOSIS — L603 Nail dystrophy: Secondary | ICD-10-CM

## 2023-03-29 DIAGNOSIS — L600 Ingrowing nail: Secondary | ICD-10-CM

## 2023-03-29 NOTE — Progress Notes (Signed)
He presents today for follow-up of his nail avulsion hallux right states today nail avulsion was done really well and the rash to the great toe and the plaque on the dorsal aspect of the foot is pretty much the same he states that he uses a cream that we prescribed and it really did not help very much so he went back to his steroid which seems to help more.  Objective: Plaques still present and the erythema around the hallux is still present but not associated with nail avulsion.  Nail pathology did not demonstrate any type of growth but did demonstrate nail dystrophy secondary to trauma.  Assessment: Nail dystrophy.  Well-healing nail avulsion.  And probable eczema or psoriatic plaques.  Plan: He will follow-up with me should this nail recur and become painful for total matrixectomy.  Plan

## 2023-05-03 ENCOUNTER — Other Ambulatory Visit: Payer: Self-pay | Admitting: Podiatry

## 2023-05-29 ENCOUNTER — Ambulatory Visit: Payer: BC Managed Care – PPO | Admitting: Podiatry

## 2023-10-04 ENCOUNTER — Telehealth: Payer: Self-pay | Admitting: Podiatry

## 2023-10-04 ENCOUNTER — Encounter: Payer: Self-pay | Admitting: Podiatry

## 2023-10-04 MED ORDER — CEPHALEXIN 500 MG PO CAPS: 500.0000 mg | ORAL_CAPSULE | Freq: Three times a day (TID) | ORAL | 0 refills | Status: AC

## 2023-10-04 NOTE — Telephone Encounter (Signed)
 The  patient called and he thinks he may have an ingrown toe nail and he does not have insurance at the moment and is not able to pay the self pay price to come in for an ingrown nail. He has been cleaning the area with soap and water, used rubbing alcohol one time only and not more, and using antibiotic ointment and a band aid. He wants to see if he can get something short term until his new insurance kicks in later on this summer. He is going to send a my chart message with a picture. Please advise?

## 2023-10-04 NOTE — Telephone Encounter (Signed)
I called the patient and he voices understanding. He did not have any questions.

## 2023-10-25 ENCOUNTER — Ambulatory Visit: Admitting: Podiatry

## 2023-10-25 ENCOUNTER — Encounter: Payer: Self-pay | Admitting: Podiatry

## 2023-10-25 DIAGNOSIS — L03031 Cellulitis of right toe: Secondary | ICD-10-CM

## 2023-10-25 DIAGNOSIS — L600 Ingrowing nail: Secondary | ICD-10-CM

## 2023-10-25 MED ORDER — CEPHALEXIN 500 MG PO CAPS: 500.0000 mg | ORAL_CAPSULE | Freq: Three times a day (TID) | ORAL | 0 refills | Status: DC

## 2023-10-25 MED ORDER — NEOMYCIN-POLYMYXIN-HC 1 % OT SOLN: OTIC | 1 refills | Status: AC

## 2023-10-25 NOTE — Progress Notes (Signed)
 He presents today after having not seen him for a year only took his hallux nail plate off right because it was so infected.  It has since grown back and become red swollen on the lateral border and is starting to drain.  He states it is quite tender and he would like to have this surgically repaired.  Objective: Vital signs are stable alert and oriented x 3.  Pulses are palpable.  Hallux right does demonstrate an ingrown toenail with erythema and edema granulation tissue purulence and some foreign debris which appears to be puffy hair.  Assessment: Ingrown nail paronychia abscess hallux right.  Plan: Discussed etiology pathology and surgical therapies at this point I am going to start him on twice daily Epsom salts warm or soaks after chemical matricectomy which was performed today.  He tolerated procedure well without complications he will also receive a prescription for Cortisporin Otic to be applied twice daily after soaking.  I will follow-up with him in about 2 weeks to make sure he is doing well.  I am also going to prescribe Keflex  500 mg twice daily.

## 2023-10-25 NOTE — Patient Instructions (Signed)

## 2023-11-15 ENCOUNTER — Encounter: Payer: Self-pay | Admitting: Podiatry

## 2023-11-15 ENCOUNTER — Ambulatory Visit (INDEPENDENT_AMBULATORY_CARE_PROVIDER_SITE_OTHER): Admitting: Podiatry

## 2023-11-15 DIAGNOSIS — L600 Ingrowing nail: Secondary | ICD-10-CM

## 2023-11-15 DIAGNOSIS — R5382 Chronic fatigue, unspecified: Secondary | ICD-10-CM | POA: Insufficient documentation

## 2023-11-15 DIAGNOSIS — D509 Iron deficiency anemia, unspecified: Secondary | ICD-10-CM | POA: Insufficient documentation

## 2023-11-15 DIAGNOSIS — Z9889 Other specified postprocedural states: Secondary | ICD-10-CM

## 2023-11-15 DIAGNOSIS — E785 Hyperlipidemia, unspecified: Secondary | ICD-10-CM | POA: Insufficient documentation

## 2023-11-15 NOTE — Progress Notes (Signed)
 He presents today chief concern of following up for his matrixectomy.  He says he is doing much better he denies having to soak it any longer.  Objective: Hallux right demonstrates well-healed matrixectomy.  Assessment: No signs of infection.  Plan: I recommended he soaks every other day Epsom salts and warm water thinks follow-up with me if it does not continue to improve until is completely healed.

## 2024-02-13 ENCOUNTER — Other Ambulatory Visit: Payer: Self-pay

## 2024-02-13 ENCOUNTER — Encounter (HOSPITAL_COMMUNITY): Payer: Self-pay

## 2024-02-13 ENCOUNTER — Emergency Department (HOSPITAL_COMMUNITY)
Admission: EM | Admit: 2024-02-13 | Discharge: 2024-02-13 | Disposition: A | Discharge status: Home / Self Care | Payer: Worker's Compensation | Attending: Emergency Medicine | Admitting: Emergency Medicine

## 2024-02-13 ENCOUNTER — Emergency Department (HOSPITAL_COMMUNITY): Payer: Worker's Compensation

## 2024-02-13 DIAGNOSIS — Z23 Encounter for immunization: Secondary | ICD-10-CM | POA: Insufficient documentation

## 2024-02-13 DIAGNOSIS — W278XXA Contact with other nonpowered hand tool, initial encounter: Secondary | ICD-10-CM | POA: Insufficient documentation

## 2024-02-13 DIAGNOSIS — S61432A Puncture wound without foreign body of left hand, initial encounter: Secondary | ICD-10-CM | POA: Insufficient documentation

## 2024-02-13 DIAGNOSIS — S6992XA Unspecified injury of left wrist, hand and finger(s), initial encounter: Secondary | ICD-10-CM | POA: Diagnosis present

## 2024-02-13 MED ORDER — ACETAMINOPHEN 325 MG PO TABS: 650.0000 mg | ORAL_TABLET | Freq: Four times a day (QID) | ORAL | 0 refills | Status: AC | PRN

## 2024-02-13 MED ORDER — IBUPROFEN 600 MG PO TABS: 600.0000 mg | ORAL_TABLET | Freq: Four times a day (QID) | ORAL | 0 refills | Status: AC | PRN

## 2024-02-13 MED ORDER — ACETAMINOPHEN 500 MG PO TABS
1000.0000 mg | ORAL_TABLET | Freq: Once | ORAL | Status: AC
  Administered 2024-02-13: 1000 mg via ORAL
  Filled 2024-02-13: qty 2

## 2024-02-13 MED ORDER — TETANUS-DIPHTH-ACELL PERTUSSIS 5-2.5-18.5 LF-MCG/0.5 IM SUSY
0.5000 mL | PREFILLED_SYRINGE | Freq: Once | INTRAMUSCULAR | Status: AC
  Administered 2024-02-13: 0.5 mL via INTRAMUSCULAR
  Filled 2024-02-13: qty 0.5

## 2024-02-13 NOTE — Discharge Instructions (Signed)
 It was a pleasure caring for you today in the emergency department.  Please keep the wound clean and dry, avoid submerging your hand underwater such as in hot tub, stream or pool as this can lead to infection.  Clean the wound twice a day with gentle soap and water.  Please follow-up with your PCP in a week for wound check.  Please return to the emergency department for any worsening or worrisome symptoms including but not limited to: Warmth around the wound, drainage from the wound, difficulty moving your fingers, worsening pain, etc.

## 2024-02-13 NOTE — ED Provider Notes (Signed)
 [place] [facility] DE[facility]MENT AT [facility] Provider Note  CSN: [id number] Arrival date & time: 02/13/24 9250  Chief Complaint(s) Puncture Wound  HPI Philip Williamson is a 43 y.o. male with past medical history as below, significant for hyperlipidemia, MDD, tobacco use who presents to the ED with complaint of left hand injury  Patient reports he was at work using a screwdriver and accidentally stabbed his hand with a screwdriver.  He did notice some bleeding after he removed the screwdriver but he did have full function of his hand.  Having some pain at the insertion point of the screwdriver.  No other injuries reported.  Unsure last tetanus.  Reports the screwdriver was relatively clean. RHD  Past Medical History Past Medical History:  Diagnosis Date   Anxiety    per pt   Depression    per pt   Patient Active Problem List   Diagnosis Date Noted   Chronic fatigue 11/15/2023   Hyperlipidemia 11/15/2023   Iron deficiency anemia 11/15/2023   Anxiety state 03/15/2023   Astigmatism 03/15/2023   Counseling, unspecified 03/15/2023   Dysthymic disorder 03/15/2023   Esophageal reflux 03/15/2023   Major depressive disorder, recurrent episode, moderate (HCC) 03/15/2023   Major depressive disorder, single episode, mild (HCC) 03/15/2023   Hyperventilation syndrome 03/15/2023   Tobacco use disorder 03/15/2023   Left wrist pain 02/24/2021   Home Medication(s) Prior to Admission medications   Medication Sig Start Date End Date Taking? Authorizing Provider  NEOMYCIN -POLYMYXIN-HYDROCORTISONE (CORTISPORIN) 1 % SOLN OTIC solution Apply 1-2 drops to toe BID after soaking 10/25/23   Hyatt, Max T, DPM  sertraline (ZOLOFT) 50 MG tablet Take 50 mg by mouth daily.    [provider]                                                                                                                                    Past Surgical History Past Surgical History:  Procedure Laterality  Date   I & D EXTREMITY Right 12/23/2013   Procedure: IRRIGATION AND DEBRIDEMENT Right Thumb with possible skin graft;  Surgeon: Elsie Mussel, MD;  Location: [facility] OR;  Service: Orthopedics;  Laterality: Right;   Family History History reviewed. No pertinent family history.  Social History Social History   Tobacco Use   Smoking status: Former    Current packs/day: 1.00    Types: Cigarettes   Smokeless tobacco: Never  Vaping Use   Vaping status: Every Day  Substance Use Topics   Alcohol use: Yes    Comment: daily   Drug use: No   Allergies Patient has no known allergies.  Review of Systems A thorough review of systems was obtained and all systems are negative except as noted in the HPI and PMH.   Physical Exam Vital Signs  I have reviewed the triage vital signs BP (!) 164/118 (BP Location: Right Arm)   Pulse (!) 105  Temp 98.7 F (37.1 C) (Oral)   Resp 16   Ht 5' 5 (1.651 m)   Wt 90.7 kg   SpO2 100%   BMI 33.28 kg/m  Physical Exam Vitals and nursing note reviewed.  Constitutional:      General: He is not in acute distress.    Appearance: Normal appearance. He is well-developed. He is not ill-appearing.  HENT:     Head: Normocephalic and atraumatic.     Right Ear: External ear normal.     Left Ear: External ear normal.     Nose: Nose normal.     Mouth/Throat:     Mouth: Mucous membranes are moist.  Eyes:     General: No scleral icterus.       Right eye: No discharge.        Left eye: No discharge.  Cardiovascular:     Rate and Rhythm: Normal rate.  Pulmonary:     Effort: Pulmonary effort is normal. No respiratory distress.     Breath sounds: No stridor.  Abdominal:     General: Abdomen is flat. There is no distension.     Tenderness: There is no guarding.  Musculoskeletal:        General: No deformity.       Hands:     Cervical back: No rigidity.  Skin:    General: Skin is warm and dry.     Coloration: Skin is not cyanotic, jaundiced or pale.   Neurological:     Mental Status: He is alert and oriented to person, place, and time.     GCS: GCS eye subscore is 4. GCS verbal subscore is 5. GCS motor subscore is 6.  Psychiatric:        Speech: Speech normal.        Behavior: Behavior normal. Behavior is cooperative.     ED Results and Treatments Labs (all labs ordered are listed, but only abnormal results are displayed) Labs Reviewed - No data to display                                                                                                                        Radiology DG Hand Complete Left Result Date: 02/13/2024 CLINICAL DATA:  Left hand puncture wound with screwdriver. EXAM: LEFT HAND - COMPLETE 3+ VIEW COMPARISON:  None Available. FINDINGS: There is no evidence of fracture or dislocation. There is no evidence of arthropathy or other focal bone abnormality. Soft tissues are unremarkable. IMPRESSION: Negative. Electronically Signed   By: Lynwood Landy Raddle M.D.   On: 02/13/2024 08:29    Pertinent labs & imaging results that were available during my care of the patient were reviewed by me and considered in my medical decision making (see MDM for details).  Medications Ordered in ED Medications  Tdap (BOOSTRIX ) injection 0.5 mL (0.5 mLs Intramuscular Given 02/13/24 0859)  acetaminophen  (TYLENOL ) tablet 1,000 mg (1,000 mg Oral Given 02/13/24 0858)  Procedures Procedures  (including critical care time)  Medical Decision Making / ED Course    Medical Decision Making:    Philip Williamson is a 43 y.o. male with past medical history as below, significant for hyperlipidemia, MDD, tobacco use who presents to the ED with complaint of left hand injury. The complaint involves an extensive differential diagnosis and also carries with it a high risk of complications and morbidity.  Serious etiology  was considered. Ddx includes but is not limited to: fb, fx, nerve injury, tendon injury, etc  Complete initial physical exam performed, notably the patient was in no distress, resting comfortably.    Reviewed and confirmed nursing documentation for past medical history, family history, social history.  Vital signs reviewed.    Puncture wound> - Accidental injury from screwdriver - Left hand is NVI, no ongoing bleeding - X-ray w/o fx or fb - Wound care, update tetanus - Wound was cleaned copiously, no bleeding.  Hand is NVI following irrigation, pain is mild - Entry wound is relatively small, will keep open and will heal by secondary intention  - Wound care precautions for home, analgesia, follow-up PCP for wound check       9:56 AM:  I have discussed the diagnosis/risks/treatment options with the patient and family.  Evaluation and diagnostic testing in the emergency department does not suggest an emergent condition requiring admission or immediate intervention beyond what has been performed at this time.  They will follow up with PCP. We also discussed returning to the ED immediately if new or worsening sx occur. We discussed the sx which are most concerning (e.g., sudden worsening pain, fever, inability to tolerate by mouth, fever, worsening pain, difficulty moving fingers, redness or warmth) that necessitate immediate return.    The patient appears reasonably screen and/or stabilized for discharge and I doubt any other medical condition or other [facility] requiring further screening, evaluation, or treatment in the ED at this time prior to discharge.                 Additional history obtained: -Additional history obtained from family -External records from outside source obtained and reviewed including: Chart review including previous notes, labs, imaging, consultation notes including  Primary care documentation   Lab Tests: Not applicable  EKG   EKG  Interpretation Date/Time:    Ventricular Rate:    PR Interval:    QRS Duration:    QT Interval:    QTC Calculation:   R Axis:      Text Interpretation:           Imaging Studies ordered: I ordered imaging studies including hand x-ray I independently visualized the following imaging with scope of interpretation limited to determining acute life threatening conditions related to emergency care; findings noted above I agree with the radiologist interpretation If any imaging was obtained with contrast I closely monitored patient for any possible adverse reaction a/w contrast administration in the emergency department   Medicines ordered and prescription drug management: Meds ordered this encounter  Medications   Tdap (BOOSTRIX ) injection 0.5 mL   acetaminophen  (TYLENOL ) tablet 1,000 mg    -I have reviewed the patients home medicines and have made adjustments as needed   Consultations Obtained: N/A  Cardiac Monitoring: Continuous pulse oximetry interpreted by myself, 100% on RA.    Social Determinants of Health:  Diagnosis or treatment significantly limited by social determinants of health: former smoker and obesity   Reevaluation: After the interventions noted above, I  reevaluated the patient and found that they have improved  Co morbidities that complicate the patient evaluation  Past Medical History:  Diagnosis Date   Anxiety    per pt   Depression    per pt      Dispostion: Disposition decision including need for hospitalization was considered, and patient discharged from emergency department.    Final Clinical Impression(s) / ED Diagnoses Final diagnoses:  Puncture wound of left hand without foreign body, initial encounter        Elnor Jayson LABOR, DO 02/13/24 [phone]

## 2024-02-13 NOTE — ED Triage Notes (Addendum)
 Pt presents to ED from work where he accidentally punctured his L hand with screwdriver. Bleeding controlled in triage. Tetanus updated 3 years ago.
